# Patient Record
Sex: Female | Born: 1935
Health system: Southern US, Community
[De-identification: ages and names within clinical notes are randomized; demographics above are authoritative.]

## PROBLEM LIST (undated history)

## (undated) DIAGNOSIS — F0390 Unspecified dementia without behavioral disturbance: Secondary | ICD-10-CM

## (undated) DIAGNOSIS — J45909 Unspecified asthma, uncomplicated: Secondary | ICD-10-CM

---

## 2011-03-04 DIAGNOSIS — H93299 Other abnormal auditory perceptions, unspecified ear: Secondary | ICD-10-CM | POA: Diagnosis not present

## 2011-03-04 DIAGNOSIS — H905 Unspecified sensorineural hearing loss: Secondary | ICD-10-CM | POA: Diagnosis not present

## 2011-03-05 DIAGNOSIS — H905 Unspecified sensorineural hearing loss: Secondary | ICD-10-CM | POA: Diagnosis not present

## 2011-03-05 DIAGNOSIS — H93299 Other abnormal auditory perceptions, unspecified ear: Secondary | ICD-10-CM | POA: Diagnosis not present

## 2011-03-06 DIAGNOSIS — H93299 Other abnormal auditory perceptions, unspecified ear: Secondary | ICD-10-CM | POA: Diagnosis not present

## 2011-03-06 DIAGNOSIS — H905 Unspecified sensorineural hearing loss: Secondary | ICD-10-CM | POA: Diagnosis not present

## 2011-03-10 DIAGNOSIS — H905 Unspecified sensorineural hearing loss: Secondary | ICD-10-CM | POA: Diagnosis not present

## 2011-03-10 DIAGNOSIS — H93299 Other abnormal auditory perceptions, unspecified ear: Secondary | ICD-10-CM | POA: Diagnosis not present

## 2011-03-11 DIAGNOSIS — H93299 Other abnormal auditory perceptions, unspecified ear: Secondary | ICD-10-CM | POA: Diagnosis not present

## 2011-03-11 DIAGNOSIS — H903 Sensorineural hearing loss, bilateral: Secondary | ICD-10-CM | POA: Diagnosis not present

## 2011-03-11 DIAGNOSIS — H905 Unspecified sensorineural hearing loss: Secondary | ICD-10-CM | POA: Diagnosis not present

## 2011-03-12 DIAGNOSIS — H93299 Other abnormal auditory perceptions, unspecified ear: Secondary | ICD-10-CM | POA: Diagnosis not present

## 2011-03-12 DIAGNOSIS — H905 Unspecified sensorineural hearing loss: Secondary | ICD-10-CM | POA: Diagnosis not present

## 2011-03-16 DIAGNOSIS — E7800 Pure hypercholesterolemia, unspecified: Secondary | ICD-10-CM | POA: Diagnosis not present

## 2011-03-16 DIAGNOSIS — I10 Essential (primary) hypertension: Secondary | ICD-10-CM | POA: Diagnosis not present

## 2011-03-16 DIAGNOSIS — M810 Age-related osteoporosis without current pathological fracture: Secondary | ICD-10-CM | POA: Diagnosis not present

## 2011-03-16 DIAGNOSIS — M109 Gout, unspecified: Secondary | ICD-10-CM | POA: Diagnosis not present

## 2011-03-16 DIAGNOSIS — N183 Chronic kidney disease, stage 3 unspecified: Secondary | ICD-10-CM | POA: Diagnosis not present

## 2011-03-16 DIAGNOSIS — Z23 Encounter for immunization: Secondary | ICD-10-CM | POA: Diagnosis not present

## 2011-03-16 DIAGNOSIS — E785 Hyperlipidemia, unspecified: Secondary | ICD-10-CM | POA: Diagnosis not present

## 2011-03-16 DIAGNOSIS — Z0000 Encounter for general adult medical examination without abnormal findings: Secondary | ICD-10-CM | POA: Diagnosis not present

## 2011-03-18 DIAGNOSIS — H905 Unspecified sensorineural hearing loss: Secondary | ICD-10-CM | POA: Diagnosis not present

## 2011-03-18 DIAGNOSIS — H93299 Other abnormal auditory perceptions, unspecified ear: Secondary | ICD-10-CM | POA: Diagnosis not present

## 2011-03-19 DIAGNOSIS — H905 Unspecified sensorineural hearing loss: Secondary | ICD-10-CM | POA: Diagnosis not present

## 2011-03-19 DIAGNOSIS — H93299 Other abnormal auditory perceptions, unspecified ear: Secondary | ICD-10-CM | POA: Diagnosis not present

## 2011-03-20 DIAGNOSIS — H93299 Other abnormal auditory perceptions, unspecified ear: Secondary | ICD-10-CM | POA: Diagnosis not present

## 2011-03-20 DIAGNOSIS — H905 Unspecified sensorineural hearing loss: Secondary | ICD-10-CM | POA: Diagnosis not present

## 2011-03-23 DIAGNOSIS — H93299 Other abnormal auditory perceptions, unspecified ear: Secondary | ICD-10-CM | POA: Diagnosis not present

## 2011-03-23 DIAGNOSIS — H905 Unspecified sensorineural hearing loss: Secondary | ICD-10-CM | POA: Diagnosis not present

## 2011-03-25 DIAGNOSIS — J45909 Unspecified asthma, uncomplicated: Secondary | ICD-10-CM | POA: Diagnosis not present

## 2011-03-25 DIAGNOSIS — J3089 Other allergic rhinitis: Secondary | ICD-10-CM | POA: Diagnosis not present

## 2011-03-25 DIAGNOSIS — J301 Allergic rhinitis due to pollen: Secondary | ICD-10-CM | POA: Diagnosis not present

## 2011-03-26 DIAGNOSIS — H905 Unspecified sensorineural hearing loss: Secondary | ICD-10-CM | POA: Diagnosis not present

## 2011-03-26 DIAGNOSIS — H93299 Other abnormal auditory perceptions, unspecified ear: Secondary | ICD-10-CM | POA: Diagnosis not present

## 2011-03-27 DIAGNOSIS — H905 Unspecified sensorineural hearing loss: Secondary | ICD-10-CM | POA: Diagnosis not present

## 2011-03-27 DIAGNOSIS — H93299 Other abnormal auditory perceptions, unspecified ear: Secondary | ICD-10-CM | POA: Diagnosis not present

## 2011-03-30 DIAGNOSIS — H905 Unspecified sensorineural hearing loss: Secondary | ICD-10-CM | POA: Diagnosis not present

## 2011-03-30 DIAGNOSIS — H93299 Other abnormal auditory perceptions, unspecified ear: Secondary | ICD-10-CM | POA: Diagnosis not present

## 2011-03-30 DIAGNOSIS — M810 Age-related osteoporosis without current pathological fracture: Secondary | ICD-10-CM | POA: Diagnosis not present

## 2011-03-31 DIAGNOSIS — H905 Unspecified sensorineural hearing loss: Secondary | ICD-10-CM | POA: Diagnosis not present

## 2011-03-31 DIAGNOSIS — H93299 Other abnormal auditory perceptions, unspecified ear: Secondary | ICD-10-CM | POA: Diagnosis not present

## 2011-04-01 DIAGNOSIS — H905 Unspecified sensorineural hearing loss: Secondary | ICD-10-CM | POA: Diagnosis not present

## 2011-04-01 DIAGNOSIS — H93299 Other abnormal auditory perceptions, unspecified ear: Secondary | ICD-10-CM | POA: Diagnosis not present

## 2011-04-06 DIAGNOSIS — H93299 Other abnormal auditory perceptions, unspecified ear: Secondary | ICD-10-CM | POA: Diagnosis not present

## 2011-04-06 DIAGNOSIS — R42 Dizziness and giddiness: Secondary | ICD-10-CM | POA: Diagnosis not present

## 2011-04-06 DIAGNOSIS — H905 Unspecified sensorineural hearing loss: Secondary | ICD-10-CM | POA: Diagnosis not present

## 2011-04-06 DIAGNOSIS — R269 Unspecified abnormalities of gait and mobility: Secondary | ICD-10-CM | POA: Diagnosis not present

## 2011-04-07 DIAGNOSIS — H93299 Other abnormal auditory perceptions, unspecified ear: Secondary | ICD-10-CM | POA: Diagnosis not present

## 2011-04-07 DIAGNOSIS — H905 Unspecified sensorineural hearing loss: Secondary | ICD-10-CM | POA: Diagnosis not present

## 2011-04-07 DIAGNOSIS — R269 Unspecified abnormalities of gait and mobility: Secondary | ICD-10-CM | POA: Diagnosis not present

## 2011-04-07 DIAGNOSIS — R42 Dizziness and giddiness: Secondary | ICD-10-CM | POA: Diagnosis not present

## 2011-04-08 DIAGNOSIS — H905 Unspecified sensorineural hearing loss: Secondary | ICD-10-CM | POA: Diagnosis not present

## 2011-04-08 DIAGNOSIS — R42 Dizziness and giddiness: Secondary | ICD-10-CM | POA: Diagnosis not present

## 2011-04-08 DIAGNOSIS — H93299 Other abnormal auditory perceptions, unspecified ear: Secondary | ICD-10-CM | POA: Diagnosis not present

## 2011-04-08 DIAGNOSIS — R269 Unspecified abnormalities of gait and mobility: Secondary | ICD-10-CM | POA: Diagnosis not present

## 2011-04-09 DIAGNOSIS — H93299 Other abnormal auditory perceptions, unspecified ear: Secondary | ICD-10-CM | POA: Diagnosis not present

## 2011-04-09 DIAGNOSIS — R42 Dizziness and giddiness: Secondary | ICD-10-CM | POA: Diagnosis not present

## 2011-04-09 DIAGNOSIS — R269 Unspecified abnormalities of gait and mobility: Secondary | ICD-10-CM | POA: Diagnosis not present

## 2011-04-09 DIAGNOSIS — H905 Unspecified sensorineural hearing loss: Secondary | ICD-10-CM | POA: Diagnosis not present

## 2011-04-13 DIAGNOSIS — Z0000 Encounter for general adult medical examination without abnormal findings: Secondary | ICD-10-CM | POA: Diagnosis not present

## 2011-04-13 DIAGNOSIS — Z9889 Other specified postprocedural states: Secondary | ICD-10-CM | POA: Diagnosis not present

## 2011-04-13 DIAGNOSIS — I10 Essential (primary) hypertension: Secondary | ICD-10-CM | POA: Diagnosis not present

## 2011-04-13 DIAGNOSIS — Z23 Encounter for immunization: Secondary | ICD-10-CM | POA: Diagnosis not present

## 2011-04-13 DIAGNOSIS — N183 Chronic kidney disease, stage 3 unspecified: Secondary | ICD-10-CM | POA: Diagnosis not present

## 2011-04-14 DIAGNOSIS — R42 Dizziness and giddiness: Secondary | ICD-10-CM | POA: Diagnosis not present

## 2011-04-14 DIAGNOSIS — H93299 Other abnormal auditory perceptions, unspecified ear: Secondary | ICD-10-CM | POA: Diagnosis not present

## 2011-04-14 DIAGNOSIS — R269 Unspecified abnormalities of gait and mobility: Secondary | ICD-10-CM | POA: Diagnosis not present

## 2011-04-14 DIAGNOSIS — H905 Unspecified sensorineural hearing loss: Secondary | ICD-10-CM | POA: Diagnosis not present

## 2011-04-15 DIAGNOSIS — R42 Dizziness and giddiness: Secondary | ICD-10-CM | POA: Diagnosis not present

## 2011-04-15 DIAGNOSIS — R269 Unspecified abnormalities of gait and mobility: Secondary | ICD-10-CM | POA: Diagnosis not present

## 2011-04-15 DIAGNOSIS — H93299 Other abnormal auditory perceptions, unspecified ear: Secondary | ICD-10-CM | POA: Diagnosis not present

## 2011-04-15 DIAGNOSIS — H905 Unspecified sensorineural hearing loss: Secondary | ICD-10-CM | POA: Diagnosis not present

## 2011-04-16 DIAGNOSIS — R42 Dizziness and giddiness: Secondary | ICD-10-CM | POA: Diagnosis not present

## 2011-04-16 DIAGNOSIS — H905 Unspecified sensorineural hearing loss: Secondary | ICD-10-CM | POA: Diagnosis not present

## 2011-04-16 DIAGNOSIS — R269 Unspecified abnormalities of gait and mobility: Secondary | ICD-10-CM | POA: Diagnosis not present

## 2011-04-16 DIAGNOSIS — H93299 Other abnormal auditory perceptions, unspecified ear: Secondary | ICD-10-CM | POA: Diagnosis not present

## 2011-04-17 DIAGNOSIS — R42 Dizziness and giddiness: Secondary | ICD-10-CM | POA: Diagnosis not present

## 2011-04-17 DIAGNOSIS — H93299 Other abnormal auditory perceptions, unspecified ear: Secondary | ICD-10-CM | POA: Diagnosis not present

## 2011-04-17 DIAGNOSIS — R269 Unspecified abnormalities of gait and mobility: Secondary | ICD-10-CM | POA: Diagnosis not present

## 2011-04-17 DIAGNOSIS — H905 Unspecified sensorineural hearing loss: Secondary | ICD-10-CM | POA: Diagnosis not present

## 2011-04-20 DIAGNOSIS — R269 Unspecified abnormalities of gait and mobility: Secondary | ICD-10-CM | POA: Diagnosis not present

## 2011-04-20 DIAGNOSIS — H93299 Other abnormal auditory perceptions, unspecified ear: Secondary | ICD-10-CM | POA: Diagnosis not present

## 2011-04-20 DIAGNOSIS — R42 Dizziness and giddiness: Secondary | ICD-10-CM | POA: Diagnosis not present

## 2011-04-20 DIAGNOSIS — H905 Unspecified sensorineural hearing loss: Secondary | ICD-10-CM | POA: Diagnosis not present

## 2011-04-21 DIAGNOSIS — H905 Unspecified sensorineural hearing loss: Secondary | ICD-10-CM | POA: Diagnosis not present

## 2011-04-21 DIAGNOSIS — H93299 Other abnormal auditory perceptions, unspecified ear: Secondary | ICD-10-CM | POA: Diagnosis not present

## 2011-04-21 DIAGNOSIS — R42 Dizziness and giddiness: Secondary | ICD-10-CM | POA: Diagnosis not present

## 2011-04-21 DIAGNOSIS — R269 Unspecified abnormalities of gait and mobility: Secondary | ICD-10-CM | POA: Diagnosis not present

## 2011-04-22 DIAGNOSIS — H905 Unspecified sensorineural hearing loss: Secondary | ICD-10-CM | POA: Diagnosis not present

## 2011-04-22 DIAGNOSIS — H93299 Other abnormal auditory perceptions, unspecified ear: Secondary | ICD-10-CM | POA: Diagnosis not present

## 2011-04-22 DIAGNOSIS — R269 Unspecified abnormalities of gait and mobility: Secondary | ICD-10-CM | POA: Diagnosis not present

## 2011-04-22 DIAGNOSIS — R42 Dizziness and giddiness: Secondary | ICD-10-CM | POA: Diagnosis not present

## 2011-04-24 DIAGNOSIS — R269 Unspecified abnormalities of gait and mobility: Secondary | ICD-10-CM | POA: Diagnosis not present

## 2011-04-24 DIAGNOSIS — H905 Unspecified sensorineural hearing loss: Secondary | ICD-10-CM | POA: Diagnosis not present

## 2011-04-24 DIAGNOSIS — R42 Dizziness and giddiness: Secondary | ICD-10-CM | POA: Diagnosis not present

## 2011-04-24 DIAGNOSIS — H93299 Other abnormal auditory perceptions, unspecified ear: Secondary | ICD-10-CM | POA: Diagnosis not present

## 2011-04-27 DIAGNOSIS — H905 Unspecified sensorineural hearing loss: Secondary | ICD-10-CM | POA: Diagnosis not present

## 2011-04-27 DIAGNOSIS — R42 Dizziness and giddiness: Secondary | ICD-10-CM | POA: Diagnosis not present

## 2011-04-27 DIAGNOSIS — H93299 Other abnormal auditory perceptions, unspecified ear: Secondary | ICD-10-CM | POA: Diagnosis not present

## 2011-04-27 DIAGNOSIS — R269 Unspecified abnormalities of gait and mobility: Secondary | ICD-10-CM | POA: Diagnosis not present

## 2011-04-28 DIAGNOSIS — R269 Unspecified abnormalities of gait and mobility: Secondary | ICD-10-CM | POA: Diagnosis not present

## 2011-04-28 DIAGNOSIS — H905 Unspecified sensorineural hearing loss: Secondary | ICD-10-CM | POA: Diagnosis not present

## 2011-04-28 DIAGNOSIS — H93299 Other abnormal auditory perceptions, unspecified ear: Secondary | ICD-10-CM | POA: Diagnosis not present

## 2011-04-28 DIAGNOSIS — R42 Dizziness and giddiness: Secondary | ICD-10-CM | POA: Diagnosis not present

## 2011-04-29 DIAGNOSIS — H905 Unspecified sensorineural hearing loss: Secondary | ICD-10-CM | POA: Diagnosis not present

## 2011-04-29 DIAGNOSIS — R269 Unspecified abnormalities of gait and mobility: Secondary | ICD-10-CM | POA: Diagnosis not present

## 2011-04-29 DIAGNOSIS — R42 Dizziness and giddiness: Secondary | ICD-10-CM | POA: Diagnosis not present

## 2011-04-29 DIAGNOSIS — H93299 Other abnormal auditory perceptions, unspecified ear: Secondary | ICD-10-CM | POA: Diagnosis not present

## 2011-04-30 DIAGNOSIS — H93299 Other abnormal auditory perceptions, unspecified ear: Secondary | ICD-10-CM | POA: Diagnosis not present

## 2011-04-30 DIAGNOSIS — R269 Unspecified abnormalities of gait and mobility: Secondary | ICD-10-CM | POA: Diagnosis not present

## 2011-04-30 DIAGNOSIS — H905 Unspecified sensorineural hearing loss: Secondary | ICD-10-CM | POA: Diagnosis not present

## 2011-04-30 DIAGNOSIS — R42 Dizziness and giddiness: Secondary | ICD-10-CM | POA: Diagnosis not present

## 2011-05-01 DIAGNOSIS — R269 Unspecified abnormalities of gait and mobility: Secondary | ICD-10-CM | POA: Diagnosis not present

## 2011-05-01 DIAGNOSIS — H93299 Other abnormal auditory perceptions, unspecified ear: Secondary | ICD-10-CM | POA: Diagnosis not present

## 2011-05-01 DIAGNOSIS — R42 Dizziness and giddiness: Secondary | ICD-10-CM | POA: Diagnosis not present

## 2011-05-01 DIAGNOSIS — H905 Unspecified sensorineural hearing loss: Secondary | ICD-10-CM | POA: Diagnosis not present

## 2011-05-04 DIAGNOSIS — R42 Dizziness and giddiness: Secondary | ICD-10-CM | POA: Diagnosis not present

## 2011-05-04 DIAGNOSIS — H905 Unspecified sensorineural hearing loss: Secondary | ICD-10-CM | POA: Diagnosis not present

## 2011-05-04 DIAGNOSIS — H93299 Other abnormal auditory perceptions, unspecified ear: Secondary | ICD-10-CM | POA: Diagnosis not present

## 2011-05-04 DIAGNOSIS — R269 Unspecified abnormalities of gait and mobility: Secondary | ICD-10-CM | POA: Diagnosis not present

## 2011-05-05 DIAGNOSIS — R42 Dizziness and giddiness: Secondary | ICD-10-CM | POA: Diagnosis not present

## 2011-05-05 DIAGNOSIS — H905 Unspecified sensorineural hearing loss: Secondary | ICD-10-CM | POA: Diagnosis not present

## 2011-05-05 DIAGNOSIS — R269 Unspecified abnormalities of gait and mobility: Secondary | ICD-10-CM | POA: Diagnosis not present

## 2011-05-05 DIAGNOSIS — H93299 Other abnormal auditory perceptions, unspecified ear: Secondary | ICD-10-CM | POA: Diagnosis not present

## 2011-05-06 DIAGNOSIS — H93299 Other abnormal auditory perceptions, unspecified ear: Secondary | ICD-10-CM | POA: Diagnosis not present

## 2011-05-06 DIAGNOSIS — H905 Unspecified sensorineural hearing loss: Secondary | ICD-10-CM | POA: Diagnosis not present

## 2011-05-06 DIAGNOSIS — R269 Unspecified abnormalities of gait and mobility: Secondary | ICD-10-CM | POA: Diagnosis not present

## 2011-05-06 DIAGNOSIS — R42 Dizziness and giddiness: Secondary | ICD-10-CM | POA: Diagnosis not present

## 2011-05-08 DIAGNOSIS — R092 Respiratory arrest: Secondary | ICD-10-CM | POA: Diagnosis not present

## 2011-05-08 DIAGNOSIS — H905 Unspecified sensorineural hearing loss: Secondary | ICD-10-CM | POA: Diagnosis not present

## 2011-05-08 DIAGNOSIS — J440 Chronic obstructive pulmonary disease with acute lower respiratory infection: Secondary | ICD-10-CM | POA: Diagnosis not present

## 2011-05-08 DIAGNOSIS — A499 Bacterial infection, unspecified: Secondary | ICD-10-CM | POA: Diagnosis not present

## 2011-05-08 DIAGNOSIS — R269 Unspecified abnormalities of gait and mobility: Secondary | ICD-10-CM | POA: Diagnosis not present

## 2011-05-08 DIAGNOSIS — R509 Fever, unspecified: Secondary | ICD-10-CM | POA: Diagnosis not present

## 2011-05-08 DIAGNOSIS — K219 Gastro-esophageal reflux disease without esophagitis: Secondary | ICD-10-CM | POA: Diagnosis not present

## 2011-05-08 DIAGNOSIS — B9689 Other specified bacterial agents as the cause of diseases classified elsewhere: Secondary | ICD-10-CM | POA: Diagnosis not present

## 2011-05-08 DIAGNOSIS — R42 Dizziness and giddiness: Secondary | ICD-10-CM | POA: Diagnosis not present

## 2011-05-08 DIAGNOSIS — N390 Urinary tract infection, site not specified: Secondary | ICD-10-CM | POA: Diagnosis not present

## 2011-05-08 DIAGNOSIS — A399 Meningococcal infection, unspecified: Secondary | ICD-10-CM | POA: Diagnosis not present

## 2011-05-08 DIAGNOSIS — R059 Cough, unspecified: Secondary | ICD-10-CM | POA: Diagnosis not present

## 2011-05-08 DIAGNOSIS — J209 Acute bronchitis, unspecified: Secondary | ICD-10-CM | POA: Diagnosis not present

## 2011-05-08 DIAGNOSIS — H93299 Other abnormal auditory perceptions, unspecified ear: Secondary | ICD-10-CM | POA: Diagnosis not present

## 2011-05-08 DIAGNOSIS — R05 Cough: Secondary | ICD-10-CM | POA: Diagnosis not present

## 2011-05-13 DIAGNOSIS — R269 Unspecified abnormalities of gait and mobility: Secondary | ICD-10-CM | POA: Diagnosis not present

## 2011-05-13 DIAGNOSIS — H905 Unspecified sensorineural hearing loss: Secondary | ICD-10-CM | POA: Diagnosis not present

## 2011-05-13 DIAGNOSIS — H93299 Other abnormal auditory perceptions, unspecified ear: Secondary | ICD-10-CM | POA: Diagnosis not present

## 2011-05-13 DIAGNOSIS — R42 Dizziness and giddiness: Secondary | ICD-10-CM | POA: Diagnosis not present

## 2011-05-19 DIAGNOSIS — N183 Chronic kidney disease, stage 3 unspecified: Secondary | ICD-10-CM | POA: Diagnosis not present

## 2011-05-19 DIAGNOSIS — J449 Chronic obstructive pulmonary disease, unspecified: Secondary | ICD-10-CM | POA: Diagnosis not present

## 2011-05-19 DIAGNOSIS — N390 Urinary tract infection, site not specified: Secondary | ICD-10-CM | POA: Diagnosis not present

## 2011-05-20 DIAGNOSIS — R269 Unspecified abnormalities of gait and mobility: Secondary | ICD-10-CM | POA: Diagnosis not present

## 2011-05-20 DIAGNOSIS — H905 Unspecified sensorineural hearing loss: Secondary | ICD-10-CM | POA: Diagnosis not present

## 2011-05-20 DIAGNOSIS — H93299 Other abnormal auditory perceptions, unspecified ear: Secondary | ICD-10-CM | POA: Diagnosis not present

## 2011-05-20 DIAGNOSIS — R42 Dizziness and giddiness: Secondary | ICD-10-CM | POA: Diagnosis not present

## 2011-06-02 DIAGNOSIS — M810 Age-related osteoporosis without current pathological fracture: Secondary | ICD-10-CM | POA: Diagnosis not present

## 2011-06-02 DIAGNOSIS — M25559 Pain in unspecified hip: Secondary | ICD-10-CM | POA: Diagnosis not present

## 2011-06-08 DIAGNOSIS — H903 Sensorineural hearing loss, bilateral: Secondary | ICD-10-CM | POA: Diagnosis not present

## 2011-06-26 DIAGNOSIS — J45909 Unspecified asthma, uncomplicated: Secondary | ICD-10-CM | POA: Diagnosis not present

## 2011-06-26 DIAGNOSIS — J479 Bronchiectasis, uncomplicated: Secondary | ICD-10-CM | POA: Diagnosis not present

## 2011-07-17 DIAGNOSIS — H903 Sensorineural hearing loss, bilateral: Secondary | ICD-10-CM | POA: Diagnosis not present

## 2011-09-01 DIAGNOSIS — J449 Chronic obstructive pulmonary disease, unspecified: Secondary | ICD-10-CM | POA: Diagnosis not present

## 2011-09-04 DIAGNOSIS — J471 Bronchiectasis with (acute) exacerbation: Secondary | ICD-10-CM | POA: Diagnosis not present

## 2011-09-04 DIAGNOSIS — J45909 Unspecified asthma, uncomplicated: Secondary | ICD-10-CM | POA: Diagnosis not present

## 2011-09-04 DIAGNOSIS — J310 Chronic rhinitis: Secondary | ICD-10-CM | POA: Diagnosis not present

## 2011-09-16 DIAGNOSIS — J479 Bronchiectasis, uncomplicated: Secondary | ICD-10-CM | POA: Diagnosis not present

## 2011-10-12 DIAGNOSIS — H9190 Unspecified hearing loss, unspecified ear: Secondary | ICD-10-CM | POA: Diagnosis not present

## 2011-10-12 DIAGNOSIS — R413 Other amnesia: Secondary | ICD-10-CM | POA: Diagnosis not present

## 2011-10-12 DIAGNOSIS — M47817 Spondylosis without myelopathy or radiculopathy, lumbosacral region: Secondary | ICD-10-CM | POA: Diagnosis not present

## 2011-10-12 DIAGNOSIS — M1990 Unspecified osteoarthritis, unspecified site: Secondary | ICD-10-CM | POA: Diagnosis not present

## 2011-10-26 DIAGNOSIS — K649 Unspecified hemorrhoids: Secondary | ICD-10-CM | POA: Diagnosis not present

## 2011-10-26 DIAGNOSIS — K625 Hemorrhage of anus and rectum: Secondary | ICD-10-CM | POA: Diagnosis not present

## 2011-10-26 DIAGNOSIS — K5900 Constipation, unspecified: Secondary | ICD-10-CM | POA: Diagnosis not present

## 2011-10-28 DIAGNOSIS — N318 Other neuromuscular dysfunction of bladder: Secondary | ICD-10-CM | POA: Diagnosis not present

## 2011-11-05 DIAGNOSIS — J479 Bronchiectasis, uncomplicated: Secondary | ICD-10-CM | POA: Diagnosis not present

## 2011-11-11 DIAGNOSIS — Z1231 Encounter for screening mammogram for malignant neoplasm of breast: Secondary | ICD-10-CM | POA: Diagnosis not present

## 2011-11-12 DIAGNOSIS — M19079 Primary osteoarthritis, unspecified ankle and foot: Secondary | ICD-10-CM | POA: Diagnosis not present

## 2011-11-12 DIAGNOSIS — M7750 Other enthesopathy of unspecified foot: Secondary | ICD-10-CM | POA: Diagnosis not present

## 2011-11-12 DIAGNOSIS — M79609 Pain in unspecified limb: Secondary | ICD-10-CM | POA: Diagnosis not present

## 2011-12-02 DIAGNOSIS — F39 Unspecified mood [affective] disorder: Secondary | ICD-10-CM | POA: Diagnosis not present

## 2011-12-02 DIAGNOSIS — Z23 Encounter for immunization: Secondary | ICD-10-CM | POA: Diagnosis not present

## 2011-12-02 DIAGNOSIS — F329 Major depressive disorder, single episode, unspecified: Secondary | ICD-10-CM | POA: Diagnosis not present

## 2011-12-02 DIAGNOSIS — F3289 Other specified depressive episodes: Secondary | ICD-10-CM | POA: Diagnosis not present

## 2011-12-21 DIAGNOSIS — H903 Sensorineural hearing loss, bilateral: Secondary | ICD-10-CM | POA: Diagnosis not present

## 2011-12-21 DIAGNOSIS — H905 Unspecified sensorineural hearing loss: Secondary | ICD-10-CM | POA: Diagnosis not present

## 2012-01-12 DIAGNOSIS — J471 Bronchiectasis with (acute) exacerbation: Secondary | ICD-10-CM | POA: Diagnosis not present

## 2012-02-03 DIAGNOSIS — H26499 Other secondary cataract, unspecified eye: Secondary | ICD-10-CM | POA: Diagnosis not present

## 2012-02-18 DIAGNOSIS — J479 Bronchiectasis, uncomplicated: Secondary | ICD-10-CM | POA: Diagnosis not present

## 2012-02-18 DIAGNOSIS — J45909 Unspecified asthma, uncomplicated: Secondary | ICD-10-CM | POA: Diagnosis not present

## 2012-02-25 DIAGNOSIS — J019 Acute sinusitis, unspecified: Secondary | ICD-10-CM | POA: Diagnosis not present

## 2012-02-25 DIAGNOSIS — R059 Cough, unspecified: Secondary | ICD-10-CM | POA: Diagnosis not present

## 2012-02-25 DIAGNOSIS — R05 Cough: Secondary | ICD-10-CM | POA: Diagnosis not present

## 2012-02-25 DIAGNOSIS — J310 Chronic rhinitis: Secondary | ICD-10-CM | POA: Diagnosis not present

## 2012-02-25 DIAGNOSIS — R918 Other nonspecific abnormal finding of lung field: Secondary | ICD-10-CM | POA: Diagnosis not present

## 2012-02-25 DIAGNOSIS — J479 Bronchiectasis, uncomplicated: Secondary | ICD-10-CM | POA: Diagnosis not present

## 2012-03-21 DIAGNOSIS — J479 Bronchiectasis, uncomplicated: Secondary | ICD-10-CM | POA: Diagnosis not present

## 2012-03-31 DIAGNOSIS — H905 Unspecified sensorineural hearing loss: Secondary | ICD-10-CM | POA: Diagnosis not present

## 2012-03-31 DIAGNOSIS — H903 Sensorineural hearing loss, bilateral: Secondary | ICD-10-CM | POA: Diagnosis not present

## 2012-03-31 DIAGNOSIS — M1710 Unilateral primary osteoarthritis, unspecified knee: Secondary | ICD-10-CM | POA: Diagnosis not present

## 2012-04-05 DIAGNOSIS — E785 Hyperlipidemia, unspecified: Secondary | ICD-10-CM | POA: Diagnosis not present

## 2012-04-05 DIAGNOSIS — Z0000 Encounter for general adult medical examination without abnormal findings: Secondary | ICD-10-CM | POA: Diagnosis not present

## 2012-04-05 DIAGNOSIS — I10 Essential (primary) hypertension: Secondary | ICD-10-CM | POA: Diagnosis not present

## 2012-04-05 DIAGNOSIS — N183 Chronic kidney disease, stage 3 unspecified: Secondary | ICD-10-CM | POA: Diagnosis not present

## 2012-04-05 DIAGNOSIS — M109 Gout, unspecified: Secondary | ICD-10-CM | POA: Diagnosis not present

## 2012-04-05 DIAGNOSIS — M810 Age-related osteoporosis without current pathological fracture: Secondary | ICD-10-CM | POA: Diagnosis not present

## 2012-04-11 DIAGNOSIS — D510 Vitamin B12 deficiency anemia due to intrinsic factor deficiency: Secondary | ICD-10-CM | POA: Diagnosis not present

## 2012-04-11 DIAGNOSIS — D649 Anemia, unspecified: Secondary | ICD-10-CM | POA: Diagnosis not present

## 2012-04-20 DIAGNOSIS — D649 Anemia, unspecified: Secondary | ICD-10-CM | POA: Diagnosis not present

## 2012-04-26 DIAGNOSIS — H9190 Unspecified hearing loss, unspecified ear: Secondary | ICD-10-CM | POA: Diagnosis not present

## 2012-04-26 DIAGNOSIS — H93299 Other abnormal auditory perceptions, unspecified ear: Secondary | ICD-10-CM | POA: Diagnosis not present

## 2012-04-28 DIAGNOSIS — H93299 Other abnormal auditory perceptions, unspecified ear: Secondary | ICD-10-CM | POA: Diagnosis not present

## 2012-04-28 DIAGNOSIS — H9190 Unspecified hearing loss, unspecified ear: Secondary | ICD-10-CM | POA: Diagnosis not present

## 2012-05-03 DIAGNOSIS — D649 Anemia, unspecified: Secondary | ICD-10-CM | POA: Diagnosis not present

## 2012-05-03 DIAGNOSIS — H9190 Unspecified hearing loss, unspecified ear: Secondary | ICD-10-CM | POA: Diagnosis not present

## 2012-05-03 DIAGNOSIS — H93299 Other abnormal auditory perceptions, unspecified ear: Secondary | ICD-10-CM | POA: Diagnosis not present

## 2012-05-06 DIAGNOSIS — H93299 Other abnormal auditory perceptions, unspecified ear: Secondary | ICD-10-CM | POA: Diagnosis not present

## 2012-05-06 DIAGNOSIS — H9190 Unspecified hearing loss, unspecified ear: Secondary | ICD-10-CM | POA: Diagnosis not present

## 2012-05-08 DIAGNOSIS — H93299 Other abnormal auditory perceptions, unspecified ear: Secondary | ICD-10-CM | POA: Diagnosis not present

## 2012-05-08 DIAGNOSIS — H9190 Unspecified hearing loss, unspecified ear: Secondary | ICD-10-CM | POA: Diagnosis not present

## 2012-05-09 DIAGNOSIS — H9190 Unspecified hearing loss, unspecified ear: Secondary | ICD-10-CM | POA: Diagnosis not present

## 2012-05-09 DIAGNOSIS — H93299 Other abnormal auditory perceptions, unspecified ear: Secondary | ICD-10-CM | POA: Diagnosis not present

## 2012-05-16 DIAGNOSIS — H9190 Unspecified hearing loss, unspecified ear: Secondary | ICD-10-CM | POA: Diagnosis not present

## 2012-05-16 DIAGNOSIS — H93299 Other abnormal auditory perceptions, unspecified ear: Secondary | ICD-10-CM | POA: Diagnosis not present

## 2012-05-20 DIAGNOSIS — H93299 Other abnormal auditory perceptions, unspecified ear: Secondary | ICD-10-CM | POA: Diagnosis not present

## 2012-05-20 DIAGNOSIS — H9190 Unspecified hearing loss, unspecified ear: Secondary | ICD-10-CM | POA: Diagnosis not present

## 2012-05-23 DIAGNOSIS — H9190 Unspecified hearing loss, unspecified ear: Secondary | ICD-10-CM | POA: Diagnosis not present

## 2012-05-23 DIAGNOSIS — H93299 Other abnormal auditory perceptions, unspecified ear: Secondary | ICD-10-CM | POA: Diagnosis not present

## 2012-05-25 DIAGNOSIS — H9190 Unspecified hearing loss, unspecified ear: Secondary | ICD-10-CM | POA: Diagnosis not present

## 2012-05-25 DIAGNOSIS — H93299 Other abnormal auditory perceptions, unspecified ear: Secondary | ICD-10-CM | POA: Diagnosis not present

## 2012-05-26 DIAGNOSIS — J471 Bronchiectasis with (acute) exacerbation: Secondary | ICD-10-CM | POA: Diagnosis not present

## 2012-05-26 DIAGNOSIS — R059 Cough, unspecified: Secondary | ICD-10-CM | POA: Diagnosis not present

## 2012-05-26 DIAGNOSIS — R05 Cough: Secondary | ICD-10-CM | POA: Diagnosis not present

## 2012-05-28 DIAGNOSIS — I129 Hypertensive chronic kidney disease with stage 1 through stage 4 chronic kidney disease, or unspecified chronic kidney disease: Secondary | ICD-10-CM | POA: Diagnosis present

## 2012-05-28 DIAGNOSIS — M810 Age-related osteoporosis without current pathological fracture: Secondary | ICD-10-CM | POA: Diagnosis present

## 2012-05-28 DIAGNOSIS — K219 Gastro-esophageal reflux disease without esophagitis: Secondary | ICD-10-CM | POA: Diagnosis present

## 2012-05-28 DIAGNOSIS — J471 Bronchiectasis with (acute) exacerbation: Secondary | ICD-10-CM | POA: Diagnosis not present

## 2012-05-28 DIAGNOSIS — M129 Arthropathy, unspecified: Secondary | ICD-10-CM | POA: Diagnosis present

## 2012-05-28 DIAGNOSIS — R0609 Other forms of dyspnea: Secondary | ICD-10-CM | POA: Diagnosis not present

## 2012-05-28 DIAGNOSIS — M47817 Spondylosis without myelopathy or radiculopathy, lumbosacral region: Secondary | ICD-10-CM | POA: Diagnosis present

## 2012-05-28 DIAGNOSIS — H9190 Unspecified hearing loss, unspecified ear: Secondary | ICD-10-CM | POA: Diagnosis present

## 2012-05-28 DIAGNOSIS — I447 Left bundle-branch block, unspecified: Secondary | ICD-10-CM | POA: Diagnosis not present

## 2012-05-28 DIAGNOSIS — N179 Acute kidney failure, unspecified: Secondary | ICD-10-CM | POA: Diagnosis not present

## 2012-05-28 DIAGNOSIS — M47812 Spondylosis without myelopathy or radiculopathy, cervical region: Secondary | ICD-10-CM | POA: Diagnosis present

## 2012-05-28 DIAGNOSIS — J45909 Unspecified asthma, uncomplicated: Secondary | ICD-10-CM | POA: Diagnosis present

## 2012-05-28 DIAGNOSIS — I10 Essential (primary) hypertension: Secondary | ICD-10-CM | POA: Diagnosis not present

## 2012-05-28 DIAGNOSIS — M6281 Muscle weakness (generalized): Secondary | ICD-10-CM | POA: Diagnosis not present

## 2012-05-28 DIAGNOSIS — E785 Hyperlipidemia, unspecified: Secondary | ICD-10-CM | POA: Diagnosis not present

## 2012-05-28 DIAGNOSIS — G4700 Insomnia, unspecified: Secondary | ICD-10-CM | POA: Diagnosis present

## 2012-05-28 DIAGNOSIS — D509 Iron deficiency anemia, unspecified: Secondary | ICD-10-CM | POA: Diagnosis not present

## 2012-05-28 DIAGNOSIS — K449 Diaphragmatic hernia without obstruction or gangrene: Secondary | ICD-10-CM | POA: Diagnosis present

## 2012-05-28 DIAGNOSIS — I959 Hypotension, unspecified: Secondary | ICD-10-CM | POA: Diagnosis not present

## 2012-05-28 DIAGNOSIS — J45901 Unspecified asthma with (acute) exacerbation: Secondary | ICD-10-CM | POA: Diagnosis not present

## 2012-05-28 DIAGNOSIS — J479 Bronchiectasis, uncomplicated: Secondary | ICD-10-CM | POA: Diagnosis not present

## 2012-05-28 DIAGNOSIS — F329 Major depressive disorder, single episode, unspecified: Secondary | ICD-10-CM | POA: Diagnosis present

## 2012-05-28 DIAGNOSIS — E7800 Pure hypercholesterolemia, unspecified: Secondary | ICD-10-CM | POA: Diagnosis not present

## 2012-05-28 DIAGNOSIS — J189 Pneumonia, unspecified organism: Secondary | ICD-10-CM | POA: Diagnosis not present

## 2012-05-28 DIAGNOSIS — N289 Disorder of kidney and ureter, unspecified: Secondary | ICD-10-CM | POA: Diagnosis not present

## 2012-05-28 DIAGNOSIS — R0602 Shortness of breath: Secondary | ICD-10-CM | POA: Diagnosis not present

## 2012-05-28 DIAGNOSIS — R0989 Other specified symptoms and signs involving the circulatory and respiratory systems: Secondary | ICD-10-CM | POA: Diagnosis not present

## 2012-05-28 DIAGNOSIS — J984 Other disorders of lung: Secondary | ICD-10-CM | POA: Diagnosis not present

## 2012-05-28 DIAGNOSIS — R918 Other nonspecific abnormal finding of lung field: Secondary | ICD-10-CM | POA: Diagnosis present

## 2012-05-28 DIAGNOSIS — R32 Unspecified urinary incontinence: Secondary | ICD-10-CM | POA: Diagnosis present

## 2012-05-28 DIAGNOSIS — R05 Cough: Secondary | ICD-10-CM | POA: Diagnosis not present

## 2012-05-28 DIAGNOSIS — F3289 Other specified depressive episodes: Secondary | ICD-10-CM | POA: Diagnosis present

## 2012-05-28 DIAGNOSIS — M109 Gout, unspecified: Secondary | ICD-10-CM | POA: Diagnosis present

## 2012-05-28 DIAGNOSIS — J441 Chronic obstructive pulmonary disease with (acute) exacerbation: Secondary | ICD-10-CM | POA: Diagnosis not present

## 2012-05-28 DIAGNOSIS — J159 Unspecified bacterial pneumonia: Secondary | ICD-10-CM | POA: Diagnosis not present

## 2012-05-28 DIAGNOSIS — D649 Anemia, unspecified: Secondary | ICD-10-CM | POA: Diagnosis not present

## 2012-05-28 DIAGNOSIS — N183 Chronic kidney disease, stage 3 unspecified: Secondary | ICD-10-CM | POA: Diagnosis not present

## 2012-05-28 DIAGNOSIS — R509 Fever, unspecified: Secondary | ICD-10-CM | POA: Diagnosis not present

## 2012-05-28 DIAGNOSIS — Z87891 Personal history of nicotine dependence: Secondary | ICD-10-CM | POA: Diagnosis not present

## 2012-05-28 DIAGNOSIS — R059 Cough, unspecified: Secondary | ICD-10-CM | POA: Diagnosis not present

## 2012-05-28 DIAGNOSIS — R9431 Abnormal electrocardiogram [ECG] [EKG]: Secondary | ICD-10-CM | POA: Diagnosis not present

## 2012-06-03 DIAGNOSIS — F329 Major depressive disorder, single episode, unspecified: Secondary | ICD-10-CM | POA: Diagnosis not present

## 2012-06-03 DIAGNOSIS — J479 Bronchiectasis, uncomplicated: Secondary | ICD-10-CM | POA: Diagnosis not present

## 2012-06-03 DIAGNOSIS — J180 Bronchopneumonia, unspecified organism: Secondary | ICD-10-CM | POA: Diagnosis not present

## 2012-06-03 DIAGNOSIS — D649 Anemia, unspecified: Secondary | ICD-10-CM | POA: Diagnosis not present

## 2012-06-03 DIAGNOSIS — J45909 Unspecified asthma, uncomplicated: Secondary | ICD-10-CM | POA: Diagnosis not present

## 2012-06-03 DIAGNOSIS — I10 Essential (primary) hypertension: Secondary | ICD-10-CM | POA: Diagnosis not present

## 2012-06-03 DIAGNOSIS — F3289 Other specified depressive episodes: Secondary | ICD-10-CM | POA: Diagnosis not present

## 2012-06-03 DIAGNOSIS — J189 Pneumonia, unspecified organism: Secondary | ICD-10-CM | POA: Diagnosis not present

## 2012-06-03 DIAGNOSIS — M6281 Muscle weakness (generalized): Secondary | ICD-10-CM | POA: Diagnosis not present

## 2012-06-03 DIAGNOSIS — D509 Iron deficiency anemia, unspecified: Secondary | ICD-10-CM | POA: Diagnosis not present

## 2012-06-03 DIAGNOSIS — N289 Disorder of kidney and ureter, unspecified: Secondary | ICD-10-CM | POA: Diagnosis not present

## 2012-06-03 DIAGNOSIS — J45901 Unspecified asthma with (acute) exacerbation: Secondary | ICD-10-CM | POA: Diagnosis not present

## 2012-06-03 DIAGNOSIS — J984 Other disorders of lung: Secondary | ICD-10-CM | POA: Diagnosis not present

## 2012-06-03 DIAGNOSIS — R059 Cough, unspecified: Secondary | ICD-10-CM | POA: Diagnosis not present

## 2012-06-03 DIAGNOSIS — J441 Chronic obstructive pulmonary disease with (acute) exacerbation: Secondary | ICD-10-CM | POA: Diagnosis not present

## 2012-06-03 DIAGNOSIS — R0602 Shortness of breath: Secondary | ICD-10-CM | POA: Diagnosis not present

## 2012-06-03 DIAGNOSIS — R05 Cough: Secondary | ICD-10-CM | POA: Diagnosis not present

## 2012-06-08 DIAGNOSIS — J180 Bronchopneumonia, unspecified organism: Secondary | ICD-10-CM | POA: Diagnosis not present

## 2012-06-14 DIAGNOSIS — J189 Pneumonia, unspecified organism: Secondary | ICD-10-CM | POA: Diagnosis not present

## 2012-06-14 DIAGNOSIS — J479 Bronchiectasis, uncomplicated: Secondary | ICD-10-CM | POA: Diagnosis not present

## 2012-06-15 DIAGNOSIS — R262 Difficulty in walking, not elsewhere classified: Secondary | ICD-10-CM | POA: Diagnosis not present

## 2012-06-15 DIAGNOSIS — J189 Pneumonia, unspecified organism: Secondary | ICD-10-CM | POA: Diagnosis not present

## 2012-06-15 DIAGNOSIS — I10 Essential (primary) hypertension: Secondary | ICD-10-CM | POA: Diagnosis not present

## 2012-06-15 DIAGNOSIS — M810 Age-related osteoporosis without current pathological fracture: Secondary | ICD-10-CM | POA: Diagnosis not present

## 2012-06-15 DIAGNOSIS — M6281 Muscle weakness (generalized): Secondary | ICD-10-CM | POA: Diagnosis not present

## 2012-06-15 DIAGNOSIS — J40 Bronchitis, not specified as acute or chronic: Secondary | ICD-10-CM | POA: Diagnosis not present

## 2012-06-15 DIAGNOSIS — H903 Sensorineural hearing loss, bilateral: Secondary | ICD-10-CM | POA: Diagnosis not present

## 2012-06-16 DIAGNOSIS — J189 Pneumonia, unspecified organism: Secondary | ICD-10-CM | POA: Diagnosis not present

## 2012-06-16 DIAGNOSIS — M47812 Spondylosis without myelopathy or radiculopathy, cervical region: Secondary | ICD-10-CM | POA: Diagnosis not present

## 2012-06-17 DIAGNOSIS — R262 Difficulty in walking, not elsewhere classified: Secondary | ICD-10-CM | POA: Diagnosis not present

## 2012-06-17 DIAGNOSIS — H903 Sensorineural hearing loss, bilateral: Secondary | ICD-10-CM | POA: Diagnosis not present

## 2012-06-17 DIAGNOSIS — J189 Pneumonia, unspecified organism: Secondary | ICD-10-CM | POA: Diagnosis not present

## 2012-06-17 DIAGNOSIS — J40 Bronchitis, not specified as acute or chronic: Secondary | ICD-10-CM | POA: Diagnosis not present

## 2012-06-17 DIAGNOSIS — I10 Essential (primary) hypertension: Secondary | ICD-10-CM | POA: Diagnosis not present

## 2012-06-17 DIAGNOSIS — M6281 Muscle weakness (generalized): Secondary | ICD-10-CM | POA: Diagnosis not present

## 2012-06-21 DIAGNOSIS — J40 Bronchitis, not specified as acute or chronic: Secondary | ICD-10-CM | POA: Diagnosis not present

## 2012-06-21 DIAGNOSIS — M6281 Muscle weakness (generalized): Secondary | ICD-10-CM | POA: Diagnosis not present

## 2012-06-21 DIAGNOSIS — J189 Pneumonia, unspecified organism: Secondary | ICD-10-CM | POA: Diagnosis not present

## 2012-06-21 DIAGNOSIS — H903 Sensorineural hearing loss, bilateral: Secondary | ICD-10-CM | POA: Diagnosis not present

## 2012-06-21 DIAGNOSIS — R262 Difficulty in walking, not elsewhere classified: Secondary | ICD-10-CM | POA: Diagnosis not present

## 2012-06-21 DIAGNOSIS — I10 Essential (primary) hypertension: Secondary | ICD-10-CM | POA: Diagnosis not present

## 2012-06-23 DIAGNOSIS — J40 Bronchitis, not specified as acute or chronic: Secondary | ICD-10-CM | POA: Diagnosis not present

## 2012-06-23 DIAGNOSIS — I10 Essential (primary) hypertension: Secondary | ICD-10-CM | POA: Diagnosis not present

## 2012-06-23 DIAGNOSIS — J189 Pneumonia, unspecified organism: Secondary | ICD-10-CM | POA: Diagnosis not present

## 2012-06-23 DIAGNOSIS — M6281 Muscle weakness (generalized): Secondary | ICD-10-CM | POA: Diagnosis not present

## 2012-06-23 DIAGNOSIS — R262 Difficulty in walking, not elsewhere classified: Secondary | ICD-10-CM | POA: Diagnosis not present

## 2012-06-23 DIAGNOSIS — H903 Sensorineural hearing loss, bilateral: Secondary | ICD-10-CM | POA: Diagnosis not present

## 2012-06-24 DIAGNOSIS — R262 Difficulty in walking, not elsewhere classified: Secondary | ICD-10-CM | POA: Diagnosis not present

## 2012-06-24 DIAGNOSIS — I10 Essential (primary) hypertension: Secondary | ICD-10-CM | POA: Diagnosis not present

## 2012-06-24 DIAGNOSIS — J189 Pneumonia, unspecified organism: Secondary | ICD-10-CM | POA: Diagnosis not present

## 2012-06-24 DIAGNOSIS — M6281 Muscle weakness (generalized): Secondary | ICD-10-CM | POA: Diagnosis not present

## 2012-06-24 DIAGNOSIS — J40 Bronchitis, not specified as acute or chronic: Secondary | ICD-10-CM | POA: Diagnosis not present

## 2012-06-24 DIAGNOSIS — H903 Sensorineural hearing loss, bilateral: Secondary | ICD-10-CM | POA: Diagnosis not present

## 2012-06-28 DIAGNOSIS — I10 Essential (primary) hypertension: Secondary | ICD-10-CM | POA: Diagnosis not present

## 2012-06-28 DIAGNOSIS — R262 Difficulty in walking, not elsewhere classified: Secondary | ICD-10-CM | POA: Diagnosis not present

## 2012-06-28 DIAGNOSIS — H903 Sensorineural hearing loss, bilateral: Secondary | ICD-10-CM | POA: Diagnosis not present

## 2012-06-28 DIAGNOSIS — M6281 Muscle weakness (generalized): Secondary | ICD-10-CM | POA: Diagnosis not present

## 2012-06-28 DIAGNOSIS — J189 Pneumonia, unspecified organism: Secondary | ICD-10-CM | POA: Diagnosis not present

## 2012-06-28 DIAGNOSIS — J40 Bronchitis, not specified as acute or chronic: Secondary | ICD-10-CM | POA: Diagnosis not present

## 2012-06-30 DIAGNOSIS — I10 Essential (primary) hypertension: Secondary | ICD-10-CM | POA: Diagnosis not present

## 2012-06-30 DIAGNOSIS — R262 Difficulty in walking, not elsewhere classified: Secondary | ICD-10-CM | POA: Diagnosis not present

## 2012-06-30 DIAGNOSIS — J40 Bronchitis, not specified as acute or chronic: Secondary | ICD-10-CM | POA: Diagnosis not present

## 2012-06-30 DIAGNOSIS — J189 Pneumonia, unspecified organism: Secondary | ICD-10-CM | POA: Diagnosis not present

## 2012-06-30 DIAGNOSIS — H903 Sensorineural hearing loss, bilateral: Secondary | ICD-10-CM | POA: Diagnosis not present

## 2012-06-30 DIAGNOSIS — M6281 Muscle weakness (generalized): Secondary | ICD-10-CM | POA: Diagnosis not present

## 2012-07-04 DIAGNOSIS — J189 Pneumonia, unspecified organism: Secondary | ICD-10-CM | POA: Diagnosis not present

## 2012-07-04 DIAGNOSIS — R262 Difficulty in walking, not elsewhere classified: Secondary | ICD-10-CM | POA: Diagnosis not present

## 2012-07-04 DIAGNOSIS — I10 Essential (primary) hypertension: Secondary | ICD-10-CM | POA: Diagnosis not present

## 2012-07-04 DIAGNOSIS — H903 Sensorineural hearing loss, bilateral: Secondary | ICD-10-CM | POA: Diagnosis not present

## 2012-07-04 DIAGNOSIS — M6281 Muscle weakness (generalized): Secondary | ICD-10-CM | POA: Diagnosis not present

## 2012-07-04 DIAGNOSIS — J40 Bronchitis, not specified as acute or chronic: Secondary | ICD-10-CM | POA: Diagnosis not present

## 2012-07-06 DIAGNOSIS — R262 Difficulty in walking, not elsewhere classified: Secondary | ICD-10-CM | POA: Diagnosis not present

## 2012-07-06 DIAGNOSIS — I10 Essential (primary) hypertension: Secondary | ICD-10-CM | POA: Diagnosis not present

## 2012-07-06 DIAGNOSIS — J40 Bronchitis, not specified as acute or chronic: Secondary | ICD-10-CM | POA: Diagnosis not present

## 2012-07-06 DIAGNOSIS — M6281 Muscle weakness (generalized): Secondary | ICD-10-CM | POA: Diagnosis not present

## 2012-07-06 DIAGNOSIS — H903 Sensorineural hearing loss, bilateral: Secondary | ICD-10-CM | POA: Diagnosis not present

## 2012-07-06 DIAGNOSIS — J189 Pneumonia, unspecified organism: Secondary | ICD-10-CM | POA: Diagnosis not present

## 2012-07-07 DIAGNOSIS — J479 Bronchiectasis, uncomplicated: Secondary | ICD-10-CM | POA: Diagnosis not present

## 2012-07-12 DIAGNOSIS — J40 Bronchitis, not specified as acute or chronic: Secondary | ICD-10-CM | POA: Diagnosis not present

## 2012-07-12 DIAGNOSIS — I10 Essential (primary) hypertension: Secondary | ICD-10-CM | POA: Diagnosis not present

## 2012-07-12 DIAGNOSIS — J189 Pneumonia, unspecified organism: Secondary | ICD-10-CM | POA: Diagnosis not present

## 2012-07-12 DIAGNOSIS — R262 Difficulty in walking, not elsewhere classified: Secondary | ICD-10-CM | POA: Diagnosis not present

## 2012-07-12 DIAGNOSIS — H903 Sensorineural hearing loss, bilateral: Secondary | ICD-10-CM | POA: Diagnosis not present

## 2012-07-12 DIAGNOSIS — M6281 Muscle weakness (generalized): Secondary | ICD-10-CM | POA: Diagnosis not present

## 2012-07-21 DIAGNOSIS — I10 Essential (primary) hypertension: Secondary | ICD-10-CM | POA: Diagnosis not present

## 2012-07-25 DIAGNOSIS — J189 Pneumonia, unspecified organism: Secondary | ICD-10-CM | POA: Diagnosis not present

## 2012-07-25 DIAGNOSIS — M6281 Muscle weakness (generalized): Secondary | ICD-10-CM | POA: Diagnosis not present

## 2012-07-25 DIAGNOSIS — R262 Difficulty in walking, not elsewhere classified: Secondary | ICD-10-CM | POA: Diagnosis not present

## 2012-07-25 DIAGNOSIS — M542 Cervicalgia: Secondary | ICD-10-CM | POA: Diagnosis not present

## 2012-07-26 DIAGNOSIS — J189 Pneumonia, unspecified organism: Secondary | ICD-10-CM | POA: Diagnosis not present

## 2012-07-27 DIAGNOSIS — M542 Cervicalgia: Secondary | ICD-10-CM | POA: Diagnosis not present

## 2012-07-27 DIAGNOSIS — J189 Pneumonia, unspecified organism: Secondary | ICD-10-CM | POA: Diagnosis not present

## 2012-07-27 DIAGNOSIS — R262 Difficulty in walking, not elsewhere classified: Secondary | ICD-10-CM | POA: Diagnosis not present

## 2012-07-27 DIAGNOSIS — M25519 Pain in unspecified shoulder: Secondary | ICD-10-CM | POA: Diagnosis not present

## 2012-07-27 DIAGNOSIS — M6281 Muscle weakness (generalized): Secondary | ICD-10-CM | POA: Diagnosis not present

## 2012-07-28 DIAGNOSIS — M25519 Pain in unspecified shoulder: Secondary | ICD-10-CM | POA: Diagnosis not present

## 2012-07-29 DIAGNOSIS — J189 Pneumonia, unspecified organism: Secondary | ICD-10-CM | POA: Diagnosis not present

## 2012-07-29 DIAGNOSIS — R059 Cough, unspecified: Secondary | ICD-10-CM | POA: Diagnosis not present

## 2012-07-29 DIAGNOSIS — R05 Cough: Secondary | ICD-10-CM | POA: Diagnosis not present

## 2012-07-29 DIAGNOSIS — J479 Bronchiectasis, uncomplicated: Secondary | ICD-10-CM | POA: Diagnosis not present

## 2012-08-01 DIAGNOSIS — J189 Pneumonia, unspecified organism: Secondary | ICD-10-CM | POA: Diagnosis not present

## 2012-08-01 DIAGNOSIS — M6281 Muscle weakness (generalized): Secondary | ICD-10-CM | POA: Diagnosis not present

## 2012-08-01 DIAGNOSIS — R262 Difficulty in walking, not elsewhere classified: Secondary | ICD-10-CM | POA: Diagnosis not present

## 2012-08-01 DIAGNOSIS — M542 Cervicalgia: Secondary | ICD-10-CM | POA: Diagnosis not present

## 2012-08-03 DIAGNOSIS — J189 Pneumonia, unspecified organism: Secondary | ICD-10-CM | POA: Diagnosis not present

## 2012-08-03 DIAGNOSIS — M6281 Muscle weakness (generalized): Secondary | ICD-10-CM | POA: Diagnosis not present

## 2012-08-03 DIAGNOSIS — R262 Difficulty in walking, not elsewhere classified: Secondary | ICD-10-CM | POA: Diagnosis not present

## 2012-08-03 DIAGNOSIS — M542 Cervicalgia: Secondary | ICD-10-CM | POA: Diagnosis not present

## 2012-08-08 DIAGNOSIS — J189 Pneumonia, unspecified organism: Secondary | ICD-10-CM | POA: Diagnosis not present

## 2012-08-08 DIAGNOSIS — M542 Cervicalgia: Secondary | ICD-10-CM | POA: Diagnosis not present

## 2012-08-08 DIAGNOSIS — M6281 Muscle weakness (generalized): Secondary | ICD-10-CM | POA: Diagnosis not present

## 2012-08-08 DIAGNOSIS — R262 Difficulty in walking, not elsewhere classified: Secondary | ICD-10-CM | POA: Diagnosis not present

## 2012-08-10 DIAGNOSIS — R262 Difficulty in walking, not elsewhere classified: Secondary | ICD-10-CM | POA: Diagnosis not present

## 2012-08-10 DIAGNOSIS — J189 Pneumonia, unspecified organism: Secondary | ICD-10-CM | POA: Diagnosis not present

## 2012-08-10 DIAGNOSIS — M542 Cervicalgia: Secondary | ICD-10-CM | POA: Diagnosis not present

## 2012-08-10 DIAGNOSIS — M6281 Muscle weakness (generalized): Secondary | ICD-10-CM | POA: Diagnosis not present

## 2012-08-15 DIAGNOSIS — J189 Pneumonia, unspecified organism: Secondary | ICD-10-CM | POA: Diagnosis not present

## 2012-08-15 DIAGNOSIS — R262 Difficulty in walking, not elsewhere classified: Secondary | ICD-10-CM | POA: Diagnosis not present

## 2012-08-15 DIAGNOSIS — M6281 Muscle weakness (generalized): Secondary | ICD-10-CM | POA: Diagnosis not present

## 2012-08-15 DIAGNOSIS — M542 Cervicalgia: Secondary | ICD-10-CM | POA: Diagnosis not present

## 2012-08-17 DIAGNOSIS — R262 Difficulty in walking, not elsewhere classified: Secondary | ICD-10-CM | POA: Diagnosis not present

## 2012-08-17 DIAGNOSIS — M6281 Muscle weakness (generalized): Secondary | ICD-10-CM | POA: Diagnosis not present

## 2012-08-17 DIAGNOSIS — M542 Cervicalgia: Secondary | ICD-10-CM | POA: Diagnosis not present

## 2012-08-17 DIAGNOSIS — J189 Pneumonia, unspecified organism: Secondary | ICD-10-CM | POA: Diagnosis not present

## 2012-08-23 DIAGNOSIS — R262 Difficulty in walking, not elsewhere classified: Secondary | ICD-10-CM | POA: Diagnosis not present

## 2012-08-23 DIAGNOSIS — J189 Pneumonia, unspecified organism: Secondary | ICD-10-CM | POA: Diagnosis not present

## 2012-08-23 DIAGNOSIS — M542 Cervicalgia: Secondary | ICD-10-CM | POA: Diagnosis not present

## 2012-08-23 DIAGNOSIS — M6281 Muscle weakness (generalized): Secondary | ICD-10-CM | POA: Diagnosis not present

## 2012-08-25 DIAGNOSIS — M6281 Muscle weakness (generalized): Secondary | ICD-10-CM | POA: Diagnosis not present

## 2012-08-25 DIAGNOSIS — M542 Cervicalgia: Secondary | ICD-10-CM | POA: Diagnosis not present

## 2012-08-25 DIAGNOSIS — R262 Difficulty in walking, not elsewhere classified: Secondary | ICD-10-CM | POA: Diagnosis not present

## 2012-08-25 DIAGNOSIS — J189 Pneumonia, unspecified organism: Secondary | ICD-10-CM | POA: Diagnosis not present

## 2012-08-29 DIAGNOSIS — M6281 Muscle weakness (generalized): Secondary | ICD-10-CM | POA: Diagnosis not present

## 2012-08-29 DIAGNOSIS — R262 Difficulty in walking, not elsewhere classified: Secondary | ICD-10-CM | POA: Diagnosis not present

## 2012-08-29 DIAGNOSIS — J189 Pneumonia, unspecified organism: Secondary | ICD-10-CM | POA: Diagnosis not present

## 2012-08-29 DIAGNOSIS — M542 Cervicalgia: Secondary | ICD-10-CM | POA: Diagnosis not present

## 2012-08-30 DIAGNOSIS — M25519 Pain in unspecified shoulder: Secondary | ICD-10-CM | POA: Diagnosis not present

## 2012-08-31 DIAGNOSIS — J189 Pneumonia, unspecified organism: Secondary | ICD-10-CM | POA: Diagnosis not present

## 2012-08-31 DIAGNOSIS — M542 Cervicalgia: Secondary | ICD-10-CM | POA: Diagnosis not present

## 2012-08-31 DIAGNOSIS — R262 Difficulty in walking, not elsewhere classified: Secondary | ICD-10-CM | POA: Diagnosis not present

## 2012-08-31 DIAGNOSIS — M6281 Muscle weakness (generalized): Secondary | ICD-10-CM | POA: Diagnosis not present

## 2012-09-07 DIAGNOSIS — J189 Pneumonia, unspecified organism: Secondary | ICD-10-CM | POA: Diagnosis not present

## 2012-09-07 DIAGNOSIS — R262 Difficulty in walking, not elsewhere classified: Secondary | ICD-10-CM | POA: Diagnosis not present

## 2012-09-07 DIAGNOSIS — M542 Cervicalgia: Secondary | ICD-10-CM | POA: Diagnosis not present

## 2012-09-07 DIAGNOSIS — M6281 Muscle weakness (generalized): Secondary | ICD-10-CM | POA: Diagnosis not present

## 2012-09-08 DIAGNOSIS — R262 Difficulty in walking, not elsewhere classified: Secondary | ICD-10-CM | POA: Diagnosis not present

## 2012-09-08 DIAGNOSIS — M6281 Muscle weakness (generalized): Secondary | ICD-10-CM | POA: Diagnosis not present

## 2012-09-08 DIAGNOSIS — M542 Cervicalgia: Secondary | ICD-10-CM | POA: Diagnosis not present

## 2012-09-08 DIAGNOSIS — J189 Pneumonia, unspecified organism: Secondary | ICD-10-CM | POA: Diagnosis not present

## 2012-09-15 DIAGNOSIS — J479 Bronchiectasis, uncomplicated: Secondary | ICD-10-CM | POA: Diagnosis not present

## 2012-09-20 DIAGNOSIS — N183 Chronic kidney disease, stage 3 unspecified: Secondary | ICD-10-CM | POA: Diagnosis not present

## 2012-09-20 DIAGNOSIS — R609 Edema, unspecified: Secondary | ICD-10-CM | POA: Diagnosis not present

## 2012-09-20 DIAGNOSIS — J45909 Unspecified asthma, uncomplicated: Secondary | ICD-10-CM | POA: Diagnosis not present

## 2012-09-22 DIAGNOSIS — W010XXA Fall on same level from slipping, tripping and stumbling without subsequent striking against object, initial encounter: Secondary | ICD-10-CM | POA: Diagnosis not present

## 2012-09-22 DIAGNOSIS — S0003XA Contusion of scalp, initial encounter: Secondary | ICD-10-CM | POA: Diagnosis not present

## 2012-09-22 DIAGNOSIS — R52 Pain, unspecified: Secondary | ICD-10-CM | POA: Diagnosis not present

## 2012-09-22 DIAGNOSIS — E785 Hyperlipidemia, unspecified: Secondary | ICD-10-CM | POA: Diagnosis not present

## 2012-09-22 DIAGNOSIS — Z87891 Personal history of nicotine dependence: Secondary | ICD-10-CM | POA: Diagnosis not present

## 2012-09-22 DIAGNOSIS — R51 Headache: Secondary | ICD-10-CM | POA: Diagnosis not present

## 2012-09-22 DIAGNOSIS — M129 Arthropathy, unspecified: Secondary | ICD-10-CM | POA: Diagnosis not present

## 2012-09-22 DIAGNOSIS — S6990XA Unspecified injury of unspecified wrist, hand and finger(s), initial encounter: Secondary | ICD-10-CM | POA: Diagnosis not present

## 2012-09-22 DIAGNOSIS — Z87440 Personal history of urinary (tract) infections: Secondary | ICD-10-CM | POA: Diagnosis not present

## 2012-09-22 DIAGNOSIS — M25569 Pain in unspecified knee: Secondary | ICD-10-CM | POA: Diagnosis not present

## 2012-09-22 DIAGNOSIS — Z859 Personal history of malignant neoplasm, unspecified: Secondary | ICD-10-CM | POA: Diagnosis not present

## 2012-09-22 DIAGNOSIS — M25539 Pain in unspecified wrist: Secondary | ICD-10-CM | POA: Diagnosis not present

## 2012-09-22 DIAGNOSIS — M542 Cervicalgia: Secondary | ICD-10-CM | POA: Diagnosis not present

## 2012-09-22 DIAGNOSIS — Z888 Allergy status to other drugs, medicaments and biological substances status: Secondary | ICD-10-CM | POA: Diagnosis not present

## 2012-09-22 DIAGNOSIS — IMO0002 Reserved for concepts with insufficient information to code with codable children: Secondary | ICD-10-CM | POA: Diagnosis not present

## 2012-09-22 DIAGNOSIS — T07XXXA Unspecified multiple injuries, initial encounter: Secondary | ICD-10-CM | POA: Diagnosis not present

## 2012-09-22 DIAGNOSIS — S61509A Unspecified open wound of unspecified wrist, initial encounter: Secondary | ICD-10-CM | POA: Diagnosis not present

## 2012-09-22 DIAGNOSIS — S60229A Contusion of unspecified hand, initial encounter: Secondary | ICD-10-CM | POA: Diagnosis not present

## 2012-09-22 DIAGNOSIS — Z79899 Other long term (current) drug therapy: Secondary | ICD-10-CM | POA: Diagnosis not present

## 2012-09-27 DIAGNOSIS — S61509A Unspecified open wound of unspecified wrist, initial encounter: Secondary | ICD-10-CM | POA: Diagnosis not present

## 2012-09-27 DIAGNOSIS — T07XXXA Unspecified multiple injuries, initial encounter: Secondary | ICD-10-CM | POA: Diagnosis not present

## 2012-09-27 DIAGNOSIS — IMO0002 Reserved for concepts with insufficient information to code with codable children: Secondary | ICD-10-CM | POA: Diagnosis not present

## 2012-10-11 DIAGNOSIS — R609 Edema, unspecified: Secondary | ICD-10-CM | POA: Diagnosis not present

## 2012-10-17 DIAGNOSIS — R279 Unspecified lack of coordination: Secondary | ICD-10-CM | POA: Diagnosis not present

## 2012-10-17 DIAGNOSIS — Z9181 History of falling: Secondary | ICD-10-CM | POA: Diagnosis not present

## 2012-10-17 DIAGNOSIS — M6281 Muscle weakness (generalized): Secondary | ICD-10-CM | POA: Diagnosis not present

## 2012-10-17 DIAGNOSIS — R262 Difficulty in walking, not elsewhere classified: Secondary | ICD-10-CM | POA: Diagnosis not present

## 2012-10-17 DIAGNOSIS — J449 Chronic obstructive pulmonary disease, unspecified: Secondary | ICD-10-CM | POA: Diagnosis not present

## 2012-10-18 DIAGNOSIS — R609 Edema, unspecified: Secondary | ICD-10-CM | POA: Diagnosis not present

## 2012-10-18 DIAGNOSIS — Z9181 History of falling: Secondary | ICD-10-CM | POA: Diagnosis not present

## 2012-10-18 DIAGNOSIS — S51009A Unspecified open wound of unspecified elbow, initial encounter: Secondary | ICD-10-CM | POA: Diagnosis not present

## 2012-10-18 DIAGNOSIS — K5289 Other specified noninfective gastroenteritis and colitis: Secondary | ICD-10-CM | POA: Diagnosis not present

## 2012-10-20 DIAGNOSIS — R262 Difficulty in walking, not elsewhere classified: Secondary | ICD-10-CM | POA: Diagnosis not present

## 2012-10-20 DIAGNOSIS — R279 Unspecified lack of coordination: Secondary | ICD-10-CM | POA: Diagnosis not present

## 2012-10-20 DIAGNOSIS — H903 Sensorineural hearing loss, bilateral: Secondary | ICD-10-CM | POA: Diagnosis not present

## 2012-10-20 DIAGNOSIS — Z9181 History of falling: Secondary | ICD-10-CM | POA: Diagnosis not present

## 2012-10-20 DIAGNOSIS — J449 Chronic obstructive pulmonary disease, unspecified: Secondary | ICD-10-CM | POA: Diagnosis not present

## 2012-10-20 DIAGNOSIS — M6281 Muscle weakness (generalized): Secondary | ICD-10-CM | POA: Diagnosis not present

## 2012-10-20 DIAGNOSIS — H905 Unspecified sensorineural hearing loss: Secondary | ICD-10-CM | POA: Diagnosis not present

## 2012-10-24 DIAGNOSIS — R262 Difficulty in walking, not elsewhere classified: Secondary | ICD-10-CM | POA: Diagnosis not present

## 2012-10-24 DIAGNOSIS — Z9181 History of falling: Secondary | ICD-10-CM | POA: Diagnosis not present

## 2012-10-24 DIAGNOSIS — M6281 Muscle weakness (generalized): Secondary | ICD-10-CM | POA: Diagnosis not present

## 2012-10-24 DIAGNOSIS — J449 Chronic obstructive pulmonary disease, unspecified: Secondary | ICD-10-CM | POA: Diagnosis not present

## 2012-10-24 DIAGNOSIS — R279 Unspecified lack of coordination: Secondary | ICD-10-CM | POA: Diagnosis not present

## 2012-10-25 DIAGNOSIS — H903 Sensorineural hearing loss, bilateral: Secondary | ICD-10-CM | POA: Diagnosis not present

## 2012-10-26 DIAGNOSIS — H903 Sensorineural hearing loss, bilateral: Secondary | ICD-10-CM | POA: Diagnosis not present

## 2012-10-27 DIAGNOSIS — Z9181 History of falling: Secondary | ICD-10-CM | POA: Diagnosis not present

## 2012-10-27 DIAGNOSIS — R262 Difficulty in walking, not elsewhere classified: Secondary | ICD-10-CM | POA: Diagnosis not present

## 2012-10-27 DIAGNOSIS — R279 Unspecified lack of coordination: Secondary | ICD-10-CM | POA: Diagnosis not present

## 2012-10-27 DIAGNOSIS — J449 Chronic obstructive pulmonary disease, unspecified: Secondary | ICD-10-CM | POA: Diagnosis not present

## 2012-10-27 DIAGNOSIS — M6281 Muscle weakness (generalized): Secondary | ICD-10-CM | POA: Diagnosis not present

## 2012-10-28 DIAGNOSIS — R059 Cough, unspecified: Secondary | ICD-10-CM | POA: Diagnosis not present

## 2012-10-28 DIAGNOSIS — R079 Chest pain, unspecified: Secondary | ICD-10-CM | POA: Diagnosis not present

## 2012-10-28 DIAGNOSIS — E785 Hyperlipidemia, unspecified: Secondary | ICD-10-CM | POA: Diagnosis not present

## 2012-10-28 DIAGNOSIS — J189 Pneumonia, unspecified organism: Secondary | ICD-10-CM | POA: Diagnosis not present

## 2012-10-28 DIAGNOSIS — Z87891 Personal history of nicotine dependence: Secondary | ICD-10-CM | POA: Diagnosis not present

## 2012-10-28 DIAGNOSIS — Z79899 Other long term (current) drug therapy: Secondary | ICD-10-CM | POA: Diagnosis not present

## 2012-10-28 DIAGNOSIS — Z9089 Acquired absence of other organs: Secondary | ICD-10-CM | POA: Diagnosis not present

## 2012-10-28 DIAGNOSIS — IMO0002 Reserved for concepts with insufficient information to code with codable children: Secondary | ICD-10-CM | POA: Diagnosis not present

## 2012-10-28 DIAGNOSIS — H9190 Unspecified hearing loss, unspecified ear: Secondary | ICD-10-CM | POA: Diagnosis not present

## 2012-10-28 DIAGNOSIS — R05 Cough: Secondary | ICD-10-CM | POA: Diagnosis not present

## 2012-10-28 DIAGNOSIS — M129 Arthropathy, unspecified: Secondary | ICD-10-CM | POA: Diagnosis not present

## 2012-10-28 DIAGNOSIS — I447 Left bundle-branch block, unspecified: Secondary | ICD-10-CM | POA: Diagnosis not present

## 2012-11-01 DIAGNOSIS — J189 Pneumonia, unspecified organism: Secondary | ICD-10-CM | POA: Diagnosis not present

## 2012-11-02 DIAGNOSIS — Z9181 History of falling: Secondary | ICD-10-CM | POA: Diagnosis not present

## 2012-11-02 DIAGNOSIS — M6281 Muscle weakness (generalized): Secondary | ICD-10-CM | POA: Diagnosis not present

## 2012-11-02 DIAGNOSIS — R262 Difficulty in walking, not elsewhere classified: Secondary | ICD-10-CM | POA: Diagnosis not present

## 2012-11-02 DIAGNOSIS — J449 Chronic obstructive pulmonary disease, unspecified: Secondary | ICD-10-CM | POA: Diagnosis not present

## 2012-11-02 DIAGNOSIS — R279 Unspecified lack of coordination: Secondary | ICD-10-CM | POA: Diagnosis not present

## 2012-11-03 DIAGNOSIS — I808 Phlebitis and thrombophlebitis of other sites: Secondary | ICD-10-CM | POA: Diagnosis not present

## 2012-11-03 DIAGNOSIS — Z79899 Other long term (current) drug therapy: Secondary | ICD-10-CM | POA: Diagnosis not present

## 2012-11-03 DIAGNOSIS — Z859 Personal history of malignant neoplasm, unspecified: Secondary | ICD-10-CM | POA: Diagnosis not present

## 2012-11-03 DIAGNOSIS — M25539 Pain in unspecified wrist: Secondary | ICD-10-CM | POA: Diagnosis not present

## 2012-11-03 DIAGNOSIS — Z87891 Personal history of nicotine dependence: Secondary | ICD-10-CM | POA: Diagnosis not present

## 2012-11-03 DIAGNOSIS — IMO0002 Reserved for concepts with insufficient information to code with codable children: Secondary | ICD-10-CM | POA: Diagnosis not present

## 2012-11-03 DIAGNOSIS — I809 Phlebitis and thrombophlebitis of unspecified site: Secondary | ICD-10-CM | POA: Diagnosis not present

## 2012-11-03 DIAGNOSIS — I998 Other disorder of circulatory system: Secondary | ICD-10-CM | POA: Diagnosis not present

## 2012-11-03 DIAGNOSIS — Z888 Allergy status to other drugs, medicaments and biological substances status: Secondary | ICD-10-CM | POA: Diagnosis not present

## 2012-11-03 DIAGNOSIS — J479 Bronchiectasis, uncomplicated: Secondary | ICD-10-CM | POA: Diagnosis not present

## 2012-11-03 DIAGNOSIS — E785 Hyperlipidemia, unspecified: Secondary | ICD-10-CM | POA: Diagnosis not present

## 2012-11-03 DIAGNOSIS — M129 Arthropathy, unspecified: Secondary | ICD-10-CM | POA: Diagnosis not present

## 2012-11-07 DIAGNOSIS — M6281 Muscle weakness (generalized): Secondary | ICD-10-CM | POA: Diagnosis not present

## 2012-11-07 DIAGNOSIS — R262 Difficulty in walking, not elsewhere classified: Secondary | ICD-10-CM | POA: Diagnosis not present

## 2012-11-07 DIAGNOSIS — R279 Unspecified lack of coordination: Secondary | ICD-10-CM | POA: Diagnosis not present

## 2012-11-07 DIAGNOSIS — Z9181 History of falling: Secondary | ICD-10-CM | POA: Diagnosis not present

## 2012-11-07 DIAGNOSIS — J449 Chronic obstructive pulmonary disease, unspecified: Secondary | ICD-10-CM | POA: Diagnosis not present

## 2012-11-08 DIAGNOSIS — M79609 Pain in unspecified limb: Secondary | ICD-10-CM | POA: Diagnosis not present

## 2012-11-08 DIAGNOSIS — I808 Phlebitis and thrombophlebitis of other sites: Secondary | ICD-10-CM | POA: Diagnosis not present

## 2012-11-08 DIAGNOSIS — J189 Pneumonia, unspecified organism: Secondary | ICD-10-CM | POA: Diagnosis not present

## 2012-11-09 DIAGNOSIS — R279 Unspecified lack of coordination: Secondary | ICD-10-CM | POA: Diagnosis not present

## 2012-11-09 DIAGNOSIS — R262 Difficulty in walking, not elsewhere classified: Secondary | ICD-10-CM | POA: Diagnosis not present

## 2012-11-09 DIAGNOSIS — J449 Chronic obstructive pulmonary disease, unspecified: Secondary | ICD-10-CM | POA: Diagnosis not present

## 2012-11-09 DIAGNOSIS — M6281 Muscle weakness (generalized): Secondary | ICD-10-CM | POA: Diagnosis not present

## 2012-11-09 DIAGNOSIS — Z9181 History of falling: Secondary | ICD-10-CM | POA: Diagnosis not present

## 2012-11-14 DIAGNOSIS — J449 Chronic obstructive pulmonary disease, unspecified: Secondary | ICD-10-CM | POA: Diagnosis not present

## 2012-11-14 DIAGNOSIS — R279 Unspecified lack of coordination: Secondary | ICD-10-CM | POA: Diagnosis not present

## 2012-11-14 DIAGNOSIS — M6281 Muscle weakness (generalized): Secondary | ICD-10-CM | POA: Diagnosis not present

## 2012-11-14 DIAGNOSIS — R262 Difficulty in walking, not elsewhere classified: Secondary | ICD-10-CM | POA: Diagnosis not present

## 2012-11-14 DIAGNOSIS — Z9181 History of falling: Secondary | ICD-10-CM | POA: Diagnosis not present

## 2012-11-15 DIAGNOSIS — Z1231 Encounter for screening mammogram for malignant neoplasm of breast: Secondary | ICD-10-CM | POA: Diagnosis not present

## 2012-11-16 DIAGNOSIS — J449 Chronic obstructive pulmonary disease, unspecified: Secondary | ICD-10-CM | POA: Diagnosis not present

## 2012-11-16 DIAGNOSIS — Z9181 History of falling: Secondary | ICD-10-CM | POA: Diagnosis not present

## 2012-11-16 DIAGNOSIS — M6281 Muscle weakness (generalized): Secondary | ICD-10-CM | POA: Diagnosis not present

## 2012-11-16 DIAGNOSIS — R262 Difficulty in walking, not elsewhere classified: Secondary | ICD-10-CM | POA: Diagnosis not present

## 2012-11-16 DIAGNOSIS — R279 Unspecified lack of coordination: Secondary | ICD-10-CM | POA: Diagnosis not present

## 2012-11-18 DIAGNOSIS — M25559 Pain in unspecified hip: Secondary | ICD-10-CM | POA: Diagnosis not present

## 2012-11-18 DIAGNOSIS — Z79899 Other long term (current) drug therapy: Secondary | ICD-10-CM | POA: Diagnosis not present

## 2012-11-18 DIAGNOSIS — W06XXXA Fall from bed, initial encounter: Secondary | ICD-10-CM | POA: Diagnosis not present

## 2012-11-18 DIAGNOSIS — M25569 Pain in unspecified knee: Secondary | ICD-10-CM | POA: Diagnosis not present

## 2012-11-18 DIAGNOSIS — Z87891 Personal history of nicotine dependence: Secondary | ICD-10-CM | POA: Diagnosis not present

## 2012-11-18 DIAGNOSIS — IMO0002 Reserved for concepts with insufficient information to code with codable children: Secondary | ICD-10-CM | POA: Diagnosis not present

## 2012-11-18 DIAGNOSIS — M79609 Pain in unspecified limb: Secondary | ICD-10-CM | POA: Diagnosis not present

## 2012-11-22 DIAGNOSIS — J449 Chronic obstructive pulmonary disease, unspecified: Secondary | ICD-10-CM | POA: Diagnosis not present

## 2012-11-22 DIAGNOSIS — R262 Difficulty in walking, not elsewhere classified: Secondary | ICD-10-CM | POA: Diagnosis not present

## 2012-11-22 DIAGNOSIS — R279 Unspecified lack of coordination: Secondary | ICD-10-CM | POA: Diagnosis not present

## 2012-11-22 DIAGNOSIS — M6281 Muscle weakness (generalized): Secondary | ICD-10-CM | POA: Diagnosis not present

## 2012-11-22 DIAGNOSIS — Z9181 History of falling: Secondary | ICD-10-CM | POA: Diagnosis not present

## 2012-11-24 DIAGNOSIS — R279 Unspecified lack of coordination: Secondary | ICD-10-CM | POA: Diagnosis not present

## 2012-11-24 DIAGNOSIS — J449 Chronic obstructive pulmonary disease, unspecified: Secondary | ICD-10-CM | POA: Diagnosis not present

## 2012-11-24 DIAGNOSIS — Z9181 History of falling: Secondary | ICD-10-CM | POA: Diagnosis not present

## 2012-11-24 DIAGNOSIS — R262 Difficulty in walking, not elsewhere classified: Secondary | ICD-10-CM | POA: Diagnosis not present

## 2012-11-24 DIAGNOSIS — M6281 Muscle weakness (generalized): Secondary | ICD-10-CM | POA: Diagnosis not present

## 2012-11-25 DIAGNOSIS — E785 Hyperlipidemia, unspecified: Secondary | ICD-10-CM | POA: Diagnosis not present

## 2012-11-25 DIAGNOSIS — M129 Arthropathy, unspecified: Secondary | ICD-10-CM | POA: Diagnosis not present

## 2012-11-25 DIAGNOSIS — R5381 Other malaise: Secondary | ICD-10-CM | POA: Diagnosis not present

## 2012-11-25 DIAGNOSIS — Z87891 Personal history of nicotine dependence: Secondary | ICD-10-CM | POA: Diagnosis not present

## 2012-11-25 DIAGNOSIS — Z79899 Other long term (current) drug therapy: Secondary | ICD-10-CM | POA: Diagnosis not present

## 2012-11-25 DIAGNOSIS — M79609 Pain in unspecified limb: Secondary | ICD-10-CM | POA: Diagnosis not present

## 2012-11-25 DIAGNOSIS — IMO0001 Reserved for inherently not codable concepts without codable children: Secondary | ICD-10-CM | POA: Diagnosis not present

## 2012-11-29 DIAGNOSIS — R262 Difficulty in walking, not elsewhere classified: Secondary | ICD-10-CM | POA: Diagnosis not present

## 2012-11-29 DIAGNOSIS — R279 Unspecified lack of coordination: Secondary | ICD-10-CM | POA: Diagnosis not present

## 2012-11-29 DIAGNOSIS — IMO0001 Reserved for inherently not codable concepts without codable children: Secondary | ICD-10-CM | POA: Diagnosis not present

## 2012-11-29 DIAGNOSIS — Z9181 History of falling: Secondary | ICD-10-CM | POA: Diagnosis not present

## 2012-11-29 DIAGNOSIS — M6281 Muscle weakness (generalized): Secondary | ICD-10-CM | POA: Diagnosis not present

## 2012-11-29 DIAGNOSIS — J449 Chronic obstructive pulmonary disease, unspecified: Secondary | ICD-10-CM | POA: Diagnosis not present

## 2012-12-02 DIAGNOSIS — M6281 Muscle weakness (generalized): Secondary | ICD-10-CM | POA: Diagnosis not present

## 2012-12-02 DIAGNOSIS — J449 Chronic obstructive pulmonary disease, unspecified: Secondary | ICD-10-CM | POA: Diagnosis not present

## 2012-12-02 DIAGNOSIS — R279 Unspecified lack of coordination: Secondary | ICD-10-CM | POA: Diagnosis not present

## 2012-12-02 DIAGNOSIS — Z9181 History of falling: Secondary | ICD-10-CM | POA: Diagnosis not present

## 2012-12-02 DIAGNOSIS — R262 Difficulty in walking, not elsewhere classified: Secondary | ICD-10-CM | POA: Diagnosis not present

## 2012-12-06 DIAGNOSIS — Z23 Encounter for immunization: Secondary | ICD-10-CM | POA: Diagnosis not present

## 2012-12-06 DIAGNOSIS — J479 Bronchiectasis, uncomplicated: Secondary | ICD-10-CM | POA: Diagnosis not present

## 2012-12-06 DIAGNOSIS — J45909 Unspecified asthma, uncomplicated: Secondary | ICD-10-CM | POA: Diagnosis not present

## 2012-12-07 DIAGNOSIS — J449 Chronic obstructive pulmonary disease, unspecified: Secondary | ICD-10-CM | POA: Diagnosis not present

## 2012-12-07 DIAGNOSIS — M6281 Muscle weakness (generalized): Secondary | ICD-10-CM | POA: Diagnosis not present

## 2012-12-07 DIAGNOSIS — R262 Difficulty in walking, not elsewhere classified: Secondary | ICD-10-CM | POA: Diagnosis not present

## 2012-12-07 DIAGNOSIS — Z9181 History of falling: Secondary | ICD-10-CM | POA: Diagnosis not present

## 2012-12-07 DIAGNOSIS — R279 Unspecified lack of coordination: Secondary | ICD-10-CM | POA: Diagnosis not present

## 2012-12-08 DIAGNOSIS — M1610 Unilateral primary osteoarthritis, unspecified hip: Secondary | ICD-10-CM | POA: Diagnosis not present

## 2012-12-08 DIAGNOSIS — Z9181 History of falling: Secondary | ICD-10-CM | POA: Diagnosis not present

## 2012-12-08 DIAGNOSIS — M169 Osteoarthritis of hip, unspecified: Secondary | ICD-10-CM | POA: Diagnosis not present

## 2012-12-08 DIAGNOSIS — M6281 Muscle weakness (generalized): Secondary | ICD-10-CM | POA: Diagnosis not present

## 2012-12-08 DIAGNOSIS — R262 Difficulty in walking, not elsewhere classified: Secondary | ICD-10-CM | POA: Diagnosis not present

## 2012-12-08 DIAGNOSIS — R279 Unspecified lack of coordination: Secondary | ICD-10-CM | POA: Diagnosis not present

## 2012-12-08 DIAGNOSIS — J449 Chronic obstructive pulmonary disease, unspecified: Secondary | ICD-10-CM | POA: Diagnosis not present

## 2012-12-08 DIAGNOSIS — M25559 Pain in unspecified hip: Secondary | ICD-10-CM | POA: Diagnosis not present

## 2012-12-13 DIAGNOSIS — J449 Chronic obstructive pulmonary disease, unspecified: Secondary | ICD-10-CM | POA: Diagnosis not present

## 2012-12-13 DIAGNOSIS — M6281 Muscle weakness (generalized): Secondary | ICD-10-CM | POA: Diagnosis not present

## 2012-12-13 DIAGNOSIS — R262 Difficulty in walking, not elsewhere classified: Secondary | ICD-10-CM | POA: Diagnosis not present

## 2012-12-13 DIAGNOSIS — Z9181 History of falling: Secondary | ICD-10-CM | POA: Diagnosis not present

## 2012-12-13 DIAGNOSIS — R279 Unspecified lack of coordination: Secondary | ICD-10-CM | POA: Diagnosis not present

## 2012-12-14 DIAGNOSIS — R279 Unspecified lack of coordination: Secondary | ICD-10-CM | POA: Diagnosis not present

## 2012-12-14 DIAGNOSIS — J449 Chronic obstructive pulmonary disease, unspecified: Secondary | ICD-10-CM | POA: Diagnosis not present

## 2012-12-14 DIAGNOSIS — R262 Difficulty in walking, not elsewhere classified: Secondary | ICD-10-CM | POA: Diagnosis not present

## 2012-12-14 DIAGNOSIS — Z9181 History of falling: Secondary | ICD-10-CM | POA: Diagnosis not present

## 2012-12-14 DIAGNOSIS — M6281 Muscle weakness (generalized): Secondary | ICD-10-CM | POA: Diagnosis not present

## 2012-12-20 DIAGNOSIS — J449 Chronic obstructive pulmonary disease, unspecified: Secondary | ICD-10-CM | POA: Diagnosis not present

## 2012-12-20 DIAGNOSIS — R279 Unspecified lack of coordination: Secondary | ICD-10-CM | POA: Diagnosis not present

## 2012-12-20 DIAGNOSIS — M6281 Muscle weakness (generalized): Secondary | ICD-10-CM | POA: Diagnosis not present

## 2012-12-20 DIAGNOSIS — Z9181 History of falling: Secondary | ICD-10-CM | POA: Diagnosis not present

## 2012-12-20 DIAGNOSIS — R262 Difficulty in walking, not elsewhere classified: Secondary | ICD-10-CM | POA: Diagnosis not present

## 2012-12-21 DIAGNOSIS — Z9181 History of falling: Secondary | ICD-10-CM | POA: Diagnosis not present

## 2012-12-21 DIAGNOSIS — M6281 Muscle weakness (generalized): Secondary | ICD-10-CM | POA: Diagnosis not present

## 2012-12-21 DIAGNOSIS — R279 Unspecified lack of coordination: Secondary | ICD-10-CM | POA: Diagnosis not present

## 2012-12-21 DIAGNOSIS — R262 Difficulty in walking, not elsewhere classified: Secondary | ICD-10-CM | POA: Diagnosis not present

## 2012-12-21 DIAGNOSIS — J449 Chronic obstructive pulmonary disease, unspecified: Secondary | ICD-10-CM | POA: Diagnosis not present

## 2012-12-22 DIAGNOSIS — M1710 Unilateral primary osteoarthritis, unspecified knee: Secondary | ICD-10-CM | POA: Diagnosis not present

## 2012-12-22 DIAGNOSIS — M25559 Pain in unspecified hip: Secondary | ICD-10-CM | POA: Diagnosis not present

## 2012-12-22 DIAGNOSIS — M1610 Unilateral primary osteoarthritis, unspecified hip: Secondary | ICD-10-CM | POA: Diagnosis not present

## 2012-12-22 DIAGNOSIS — M25519 Pain in unspecified shoulder: Secondary | ICD-10-CM | POA: Diagnosis not present

## 2012-12-22 DIAGNOSIS — M169 Osteoarthritis of hip, unspecified: Secondary | ICD-10-CM | POA: Diagnosis not present

## 2012-12-29 DIAGNOSIS — R279 Unspecified lack of coordination: Secondary | ICD-10-CM | POA: Diagnosis not present

## 2012-12-29 DIAGNOSIS — J449 Chronic obstructive pulmonary disease, unspecified: Secondary | ICD-10-CM | POA: Diagnosis not present

## 2012-12-29 DIAGNOSIS — R262 Difficulty in walking, not elsewhere classified: Secondary | ICD-10-CM | POA: Diagnosis not present

## 2012-12-29 DIAGNOSIS — M6281 Muscle weakness (generalized): Secondary | ICD-10-CM | POA: Diagnosis not present

## 2012-12-29 DIAGNOSIS — Z9181 History of falling: Secondary | ICD-10-CM | POA: Diagnosis not present

## 2013-01-02 DIAGNOSIS — R279 Unspecified lack of coordination: Secondary | ICD-10-CM | POA: Diagnosis not present

## 2013-01-02 DIAGNOSIS — M2550 Pain in unspecified joint: Secondary | ICD-10-CM | POA: Diagnosis not present

## 2013-01-02 DIAGNOSIS — J449 Chronic obstructive pulmonary disease, unspecified: Secondary | ICD-10-CM | POA: Diagnosis not present

## 2013-01-02 DIAGNOSIS — Z9181 History of falling: Secondary | ICD-10-CM | POA: Diagnosis not present

## 2013-01-02 DIAGNOSIS — M6281 Muscle weakness (generalized): Secondary | ICD-10-CM | POA: Diagnosis not present

## 2013-01-02 DIAGNOSIS — R262 Difficulty in walking, not elsewhere classified: Secondary | ICD-10-CM | POA: Diagnosis not present

## 2013-01-02 DIAGNOSIS — M159 Polyosteoarthritis, unspecified: Secondary | ICD-10-CM | POA: Diagnosis not present

## 2013-01-03 DIAGNOSIS — M353 Polymyalgia rheumatica: Secondary | ICD-10-CM | POA: Diagnosis not present

## 2013-01-03 DIAGNOSIS — R279 Unspecified lack of coordination: Secondary | ICD-10-CM | POA: Diagnosis not present

## 2013-01-03 DIAGNOSIS — R262 Difficulty in walking, not elsewhere classified: Secondary | ICD-10-CM | POA: Diagnosis not present

## 2013-01-03 DIAGNOSIS — M6281 Muscle weakness (generalized): Secondary | ICD-10-CM | POA: Diagnosis not present

## 2013-01-03 DIAGNOSIS — M2550 Pain in unspecified joint: Secondary | ICD-10-CM | POA: Diagnosis not present

## 2013-01-03 DIAGNOSIS — Z9181 History of falling: Secondary | ICD-10-CM | POA: Diagnosis not present

## 2013-01-03 DIAGNOSIS — M159 Polyosteoarthritis, unspecified: Secondary | ICD-10-CM | POA: Diagnosis not present

## 2013-01-04 DIAGNOSIS — R262 Difficulty in walking, not elsewhere classified: Secondary | ICD-10-CM | POA: Diagnosis not present

## 2013-01-04 DIAGNOSIS — Z9181 History of falling: Secondary | ICD-10-CM | POA: Diagnosis not present

## 2013-01-04 DIAGNOSIS — R279 Unspecified lack of coordination: Secondary | ICD-10-CM | POA: Diagnosis not present

## 2013-01-04 DIAGNOSIS — M6281 Muscle weakness (generalized): Secondary | ICD-10-CM | POA: Diagnosis not present

## 2013-01-04 DIAGNOSIS — M159 Polyosteoarthritis, unspecified: Secondary | ICD-10-CM | POA: Diagnosis not present

## 2013-01-04 DIAGNOSIS — M2550 Pain in unspecified joint: Secondary | ICD-10-CM | POA: Diagnosis not present

## 2013-01-05 DIAGNOSIS — M6281 Muscle weakness (generalized): Secondary | ICD-10-CM | POA: Diagnosis not present

## 2013-01-05 DIAGNOSIS — R279 Unspecified lack of coordination: Secondary | ICD-10-CM | POA: Diagnosis not present

## 2013-01-05 DIAGNOSIS — M2550 Pain in unspecified joint: Secondary | ICD-10-CM | POA: Diagnosis not present

## 2013-01-05 DIAGNOSIS — M159 Polyosteoarthritis, unspecified: Secondary | ICD-10-CM | POA: Diagnosis not present

## 2013-01-05 DIAGNOSIS — R262 Difficulty in walking, not elsewhere classified: Secondary | ICD-10-CM | POA: Diagnosis not present

## 2013-01-05 DIAGNOSIS — Z9181 History of falling: Secondary | ICD-10-CM | POA: Diagnosis not present

## 2013-01-10 DIAGNOSIS — M353 Polymyalgia rheumatica: Secondary | ICD-10-CM | POA: Diagnosis not present

## 2013-01-10 DIAGNOSIS — IMO0001 Reserved for inherently not codable concepts without codable children: Secondary | ICD-10-CM | POA: Diagnosis not present

## 2013-01-11 DIAGNOSIS — Z9181 History of falling: Secondary | ICD-10-CM | POA: Diagnosis not present

## 2013-01-11 DIAGNOSIS — R262 Difficulty in walking, not elsewhere classified: Secondary | ICD-10-CM | POA: Diagnosis not present

## 2013-01-11 DIAGNOSIS — R279 Unspecified lack of coordination: Secondary | ICD-10-CM | POA: Diagnosis not present

## 2013-01-11 DIAGNOSIS — M6281 Muscle weakness (generalized): Secondary | ICD-10-CM | POA: Diagnosis not present

## 2013-01-11 DIAGNOSIS — M2550 Pain in unspecified joint: Secondary | ICD-10-CM | POA: Diagnosis not present

## 2013-01-11 DIAGNOSIS — M159 Polyosteoarthritis, unspecified: Secondary | ICD-10-CM | POA: Diagnosis not present

## 2013-01-12 DIAGNOSIS — Z9181 History of falling: Secondary | ICD-10-CM | POA: Diagnosis not present

## 2013-01-12 DIAGNOSIS — M2550 Pain in unspecified joint: Secondary | ICD-10-CM | POA: Diagnosis not present

## 2013-01-12 DIAGNOSIS — M6281 Muscle weakness (generalized): Secondary | ICD-10-CM | POA: Diagnosis not present

## 2013-01-12 DIAGNOSIS — M159 Polyosteoarthritis, unspecified: Secondary | ICD-10-CM | POA: Diagnosis not present

## 2013-01-12 DIAGNOSIS — R279 Unspecified lack of coordination: Secondary | ICD-10-CM | POA: Diagnosis not present

## 2013-01-12 DIAGNOSIS — R262 Difficulty in walking, not elsewhere classified: Secondary | ICD-10-CM | POA: Diagnosis not present

## 2013-01-13 DIAGNOSIS — M6281 Muscle weakness (generalized): Secondary | ICD-10-CM | POA: Diagnosis not present

## 2013-01-13 DIAGNOSIS — Z9181 History of falling: Secondary | ICD-10-CM | POA: Diagnosis not present

## 2013-01-13 DIAGNOSIS — M159 Polyosteoarthritis, unspecified: Secondary | ICD-10-CM | POA: Diagnosis not present

## 2013-01-13 DIAGNOSIS — R262 Difficulty in walking, not elsewhere classified: Secondary | ICD-10-CM | POA: Diagnosis not present

## 2013-01-13 DIAGNOSIS — R279 Unspecified lack of coordination: Secondary | ICD-10-CM | POA: Diagnosis not present

## 2013-01-13 DIAGNOSIS — M2550 Pain in unspecified joint: Secondary | ICD-10-CM | POA: Diagnosis not present

## 2013-01-17 DIAGNOSIS — M353 Polymyalgia rheumatica: Secondary | ICD-10-CM | POA: Diagnosis not present

## 2013-01-17 DIAGNOSIS — Z79899 Other long term (current) drug therapy: Secondary | ICD-10-CM | POA: Diagnosis not present

## 2013-01-19 DIAGNOSIS — Z9181 History of falling: Secondary | ICD-10-CM | POA: Diagnosis not present

## 2013-01-19 DIAGNOSIS — R262 Difficulty in walking, not elsewhere classified: Secondary | ICD-10-CM | POA: Diagnosis not present

## 2013-01-19 DIAGNOSIS — M2550 Pain in unspecified joint: Secondary | ICD-10-CM | POA: Diagnosis not present

## 2013-01-19 DIAGNOSIS — R279 Unspecified lack of coordination: Secondary | ICD-10-CM | POA: Diagnosis not present

## 2013-01-19 DIAGNOSIS — M6281 Muscle weakness (generalized): Secondary | ICD-10-CM | POA: Diagnosis not present

## 2013-01-19 DIAGNOSIS — R7309 Other abnormal glucose: Secondary | ICD-10-CM | POA: Diagnosis not present

## 2013-01-19 DIAGNOSIS — M159 Polyosteoarthritis, unspecified: Secondary | ICD-10-CM | POA: Diagnosis not present

## 2013-03-10 DIAGNOSIS — J42 Unspecified chronic bronchitis: Secondary | ICD-10-CM | POA: Diagnosis not present

## 2013-03-10 DIAGNOSIS — R488 Other symbolic dysfunctions: Secondary | ICD-10-CM | POA: Diagnosis not present

## 2013-03-10 DIAGNOSIS — M6281 Muscle weakness (generalized): Secondary | ICD-10-CM | POA: Diagnosis not present

## 2013-03-16 DIAGNOSIS — R488 Other symbolic dysfunctions: Secondary | ICD-10-CM | POA: Diagnosis not present

## 2013-03-16 DIAGNOSIS — J42 Unspecified chronic bronchitis: Secondary | ICD-10-CM | POA: Diagnosis not present

## 2013-03-16 DIAGNOSIS — M6281 Muscle weakness (generalized): Secondary | ICD-10-CM | POA: Diagnosis not present

## 2013-03-21 DIAGNOSIS — J42 Unspecified chronic bronchitis: Secondary | ICD-10-CM | POA: Diagnosis not present

## 2013-03-21 DIAGNOSIS — R488 Other symbolic dysfunctions: Secondary | ICD-10-CM | POA: Diagnosis not present

## 2013-03-21 DIAGNOSIS — M6281 Muscle weakness (generalized): Secondary | ICD-10-CM | POA: Diagnosis not present

## 2013-03-22 DIAGNOSIS — R488 Other symbolic dysfunctions: Secondary | ICD-10-CM | POA: Diagnosis not present

## 2013-03-22 DIAGNOSIS — R413 Other amnesia: Secondary | ICD-10-CM | POA: Diagnosis not present

## 2013-03-22 DIAGNOSIS — E7800 Pure hypercholesterolemia, unspecified: Secondary | ICD-10-CM | POA: Diagnosis not present

## 2013-03-22 DIAGNOSIS — M109 Gout, unspecified: Secondary | ICD-10-CM | POA: Diagnosis not present

## 2013-03-22 DIAGNOSIS — IMO0002 Reserved for concepts with insufficient information to code with codable children: Secondary | ICD-10-CM | POA: Diagnosis not present

## 2013-03-22 DIAGNOSIS — M6281 Muscle weakness (generalized): Secondary | ICD-10-CM | POA: Diagnosis not present

## 2013-03-22 DIAGNOSIS — J42 Unspecified chronic bronchitis: Secondary | ICD-10-CM | POA: Diagnosis not present

## 2013-03-29 DIAGNOSIS — M6281 Muscle weakness (generalized): Secondary | ICD-10-CM | POA: Diagnosis not present

## 2013-03-29 DIAGNOSIS — R488 Other symbolic dysfunctions: Secondary | ICD-10-CM | POA: Diagnosis not present

## 2013-03-29 DIAGNOSIS — J42 Unspecified chronic bronchitis: Secondary | ICD-10-CM | POA: Diagnosis not present

## 2013-03-30 DIAGNOSIS — R488 Other symbolic dysfunctions: Secondary | ICD-10-CM | POA: Diagnosis not present

## 2013-03-30 DIAGNOSIS — J42 Unspecified chronic bronchitis: Secondary | ICD-10-CM | POA: Diagnosis not present

## 2013-03-30 DIAGNOSIS — M6281 Muscle weakness (generalized): Secondary | ICD-10-CM | POA: Diagnosis not present

## 2013-03-31 DIAGNOSIS — M6281 Muscle weakness (generalized): Secondary | ICD-10-CM | POA: Diagnosis not present

## 2013-03-31 DIAGNOSIS — R488 Other symbolic dysfunctions: Secondary | ICD-10-CM | POA: Diagnosis not present

## 2013-03-31 DIAGNOSIS — J42 Unspecified chronic bronchitis: Secondary | ICD-10-CM | POA: Diagnosis not present

## 2013-04-04 DIAGNOSIS — J42 Unspecified chronic bronchitis: Secondary | ICD-10-CM | POA: Diagnosis not present

## 2013-04-04 DIAGNOSIS — R488 Other symbolic dysfunctions: Secondary | ICD-10-CM | POA: Diagnosis not present

## 2013-04-04 DIAGNOSIS — M6281 Muscle weakness (generalized): Secondary | ICD-10-CM | POA: Diagnosis not present

## 2013-04-05 DIAGNOSIS — J42 Unspecified chronic bronchitis: Secondary | ICD-10-CM | POA: Diagnosis not present

## 2013-04-05 DIAGNOSIS — R488 Other symbolic dysfunctions: Secondary | ICD-10-CM | POA: Diagnosis not present

## 2013-04-05 DIAGNOSIS — M6281 Muscle weakness (generalized): Secondary | ICD-10-CM | POA: Diagnosis not present

## 2013-04-06 DIAGNOSIS — R488 Other symbolic dysfunctions: Secondary | ICD-10-CM | POA: Diagnosis not present

## 2013-04-06 DIAGNOSIS — J42 Unspecified chronic bronchitis: Secondary | ICD-10-CM | POA: Diagnosis not present

## 2013-04-06 DIAGNOSIS — M6281 Muscle weakness (generalized): Secondary | ICD-10-CM | POA: Diagnosis not present

## 2013-04-07 DIAGNOSIS — R488 Other symbolic dysfunctions: Secondary | ICD-10-CM | POA: Diagnosis not present

## 2013-04-07 DIAGNOSIS — M6281 Muscle weakness (generalized): Secondary | ICD-10-CM | POA: Diagnosis not present

## 2013-04-07 DIAGNOSIS — J42 Unspecified chronic bronchitis: Secondary | ICD-10-CM | POA: Diagnosis not present

## 2013-04-12 DIAGNOSIS — Z961 Presence of intraocular lens: Secondary | ICD-10-CM | POA: Diagnosis not present

## 2013-04-18 DIAGNOSIS — M353 Polymyalgia rheumatica: Secondary | ICD-10-CM | POA: Diagnosis not present

## 2013-04-18 DIAGNOSIS — R5383 Other fatigue: Secondary | ICD-10-CM | POA: Diagnosis not present

## 2013-04-18 DIAGNOSIS — N184 Chronic kidney disease, stage 4 (severe): Secondary | ICD-10-CM | POA: Diagnosis not present

## 2013-04-18 DIAGNOSIS — R413 Other amnesia: Secondary | ICD-10-CM | POA: Diagnosis not present

## 2013-04-18 DIAGNOSIS — R5381 Other malaise: Secondary | ICD-10-CM | POA: Diagnosis not present

## 2013-04-18 DIAGNOSIS — Z79899 Other long term (current) drug therapy: Secondary | ICD-10-CM | POA: Diagnosis not present

## 2013-05-14 ENCOUNTER — Emergency Department (HOSPITAL_COMMUNITY)
Admission: EM | Admit: 2013-05-14 | Discharge: 2013-05-14 | Disposition: A | Discharge status: Home / Self Care | Payer: Medicare Other | Attending: Emergency Medicine | Admitting: Emergency Medicine

## 2013-05-14 ENCOUNTER — Encounter (HOSPITAL_COMMUNITY): Payer: Self-pay | Admitting: Emergency Medicine

## 2013-05-14 ENCOUNTER — Emergency Department (HOSPITAL_COMMUNITY): Payer: Medicare Other

## 2013-05-14 DIAGNOSIS — J45909 Unspecified asthma, uncomplicated: Secondary | ICD-10-CM | POA: Insufficient documentation

## 2013-05-14 DIAGNOSIS — IMO0002 Reserved for concepts with insufficient information to code with codable children: Secondary | ICD-10-CM | POA: Diagnosis not present

## 2013-05-14 DIAGNOSIS — N390 Urinary tract infection, site not specified: Secondary | ICD-10-CM

## 2013-05-14 DIAGNOSIS — E860 Dehydration: Secondary | ICD-10-CM | POA: Diagnosis not present

## 2013-05-14 DIAGNOSIS — R197 Diarrhea, unspecified: Secondary | ICD-10-CM

## 2013-05-14 DIAGNOSIS — R112 Nausea with vomiting, unspecified: Secondary | ICD-10-CM | POA: Diagnosis not present

## 2013-05-14 DIAGNOSIS — R059 Cough, unspecified: Secondary | ICD-10-CM | POA: Diagnosis not present

## 2013-05-14 DIAGNOSIS — R0602 Shortness of breath: Secondary | ICD-10-CM | POA: Insufficient documentation

## 2013-05-14 DIAGNOSIS — R05 Cough: Secondary | ICD-10-CM | POA: Diagnosis not present

## 2013-05-14 DIAGNOSIS — Z79899 Other long term (current) drug therapy: Secondary | ICD-10-CM | POA: Insufficient documentation

## 2013-05-14 DIAGNOSIS — R6889 Other general symptoms and signs: Secondary | ICD-10-CM | POA: Diagnosis not present

## 2013-05-14 DIAGNOSIS — R231 Pallor: Secondary | ICD-10-CM | POA: Diagnosis not present

## 2013-05-14 DIAGNOSIS — I7090 Unspecified atherosclerosis: Secondary | ICD-10-CM | POA: Diagnosis not present

## 2013-05-14 HISTORY — DX: Unspecified asthma, uncomplicated: J45.909

## 2013-05-14 LAB — URINALYSIS, ROUTINE W REFLEX MICROSCOPIC
BILIRUBIN URINE: NEGATIVE
GLUCOSE, UA: NEGATIVE mg/dL
KETONES UR: NEGATIVE mg/dL
Leukocytes, UA: NEGATIVE
Nitrite: POSITIVE — AB
PH: 5 (ref 5.0–8.0)
Specific Gravity, Urine: 1.022 (ref 1.005–1.030)
Urobilinogen, UA: 0.2 mg/dL (ref 0.0–1.0)

## 2013-05-14 LAB — COMPREHENSIVE METABOLIC PANEL
ALBUMIN: 3.8 g/dL (ref 3.5–5.2)
ALK PHOS: 55 U/L (ref 39–117)
ALT: 32 U/L (ref 0–35)
AST: 35 U/L (ref 0–37)
BILIRUBIN TOTAL: 0.4 mg/dL (ref 0.3–1.2)
BUN: 35 mg/dL — ABNORMAL HIGH (ref 6–23)
CHLORIDE: 99 meq/L (ref 96–112)
CO2: 22 meq/L (ref 19–32)
CREATININE: 1.29 mg/dL — AB (ref 0.50–1.10)
Calcium: 9.9 mg/dL (ref 8.4–10.5)
GFR calc Af Amer: 42 mL/min — ABNORMAL LOW (ref >90)
GFR, EST NON AFRICAN AMERICAN: 36 mL/min — AB (ref >90)
Glucose, Bld: 144 mg/dL — ABNORMAL HIGH (ref 70–99)
POTASSIUM: 3.9 meq/L (ref 3.7–5.3)
Sodium: 141 mEq/L (ref 137–147)
Total Protein: 7 g/dL (ref 6.0–8.3)

## 2013-05-14 LAB — CBC WITH DIFFERENTIAL/PLATELET
BASOS PCT: 0 % (ref 0–1)
Basophils Absolute: 0 10*3/uL (ref 0.0–0.1)
Eosinophils Absolute: 0.2 10*3/uL (ref 0.0–0.7)
Eosinophils Relative: 1 % (ref 0–5)
HEMATOCRIT: 45.6 % (ref 36.0–46.0)
Hemoglobin: 15.4 g/dL — ABNORMAL HIGH (ref 12.0–15.0)
LYMPHS PCT: 7 % — AB (ref 12–46)
Lymphs Abs: 1 10*3/uL (ref 0.7–4.0)
MCH: 30.7 pg (ref 26.0–34.0)
MCHC: 33.8 g/dL (ref 30.0–36.0)
MCV: 91 fL (ref 78.0–100.0)
MONO ABS: 1 10*3/uL (ref 0.1–1.0)
Monocytes Relative: 7 % (ref 3–12)
NEUTROS ABS: 13 10*3/uL — AB (ref 1.7–7.7)
NEUTROS PCT: 86 % — AB (ref 43–77)
Platelets: 181 10*3/uL (ref 150–400)
RBC: 5.01 MIL/uL (ref 3.87–5.11)
RDW: 15 % (ref 11.5–15.5)
WBC: 15.2 10*3/uL — AB (ref 4.0–10.5)

## 2013-05-14 LAB — TROPONIN I: Troponin I: <0.3 ng/mL (ref <0.30)

## 2013-05-14 LAB — CLOSTRIDIUM DIFFICILE BY PCR: Toxigenic C. Difficile by PCR: NEGATIVE

## 2013-05-14 LAB — LIPASE, BLOOD: LIPASE: 54 U/L (ref 11–59)

## 2013-05-14 LAB — URINE MICROSCOPIC-ADD ON

## 2013-05-14 MED ORDER — ONDANSETRON HCL 4 MG/2ML IJ SOLN
4.0000 mg | Freq: Once | INTRAMUSCULAR | Status: AC
  Administered 2013-05-14: 4 mg via INTRAVENOUS
  Filled 2013-05-14: qty 2

## 2013-05-14 MED ORDER — SODIUM CHLORIDE 0.9 % IV BOLUS (SEPSIS)
700.0000 mL | Freq: Once | INTRAVENOUS | Status: AC
  Administered 2013-05-14: 700 mL via INTRAVENOUS

## 2013-05-14 MED ORDER — SODIUM CHLORIDE 0.9 % IV SOLN
INTRAVENOUS | Status: DC
  Administered 2013-05-14: 09:00:00 via INTRAVENOUS

## 2013-05-14 MED ORDER — ONDANSETRON HCL 4 MG PO TABS: 4.0000 mg | ORAL_TABLET | Freq: Three times a day (TID) | ORAL | Status: DC | PRN

## 2013-05-14 MED ORDER — CEFTRIAXONE SODIUM 1 G IJ SOLR
1.0000 g | Freq: Once | INTRAMUSCULAR | Status: AC
  Administered 2013-05-14: 1 g via INTRAVENOUS
  Filled 2013-05-14: qty 10

## 2013-05-14 MED ORDER — SODIUM CHLORIDE 0.9 % IV BOLUS (SEPSIS)
500.0000 mL | Freq: Once | INTRAVENOUS | Status: AC
  Administered 2013-05-14: 500 mL via INTRAVENOUS

## 2013-05-14 MED ORDER — CEPHALEXIN 500 MG PO CAPS: 500.0000 mg | ORAL_CAPSULE | Freq: Three times a day (TID) | ORAL | Status: DC

## 2013-05-14 MED ORDER — ONDANSETRON HCL 4 MG/2ML IJ SOLN
4.0000 mg | Freq: Once | INTRAMUSCULAR | Status: DC
  Filled 2013-05-14: qty 2

## 2013-05-14 NOTE — ED Notes (Signed)
Michele Maxwell [phone] Contact if needed.

## 2013-05-14 NOTE — ED Notes (Signed)
Patient states that she does not have to use the restroom at this time, acknowledges that we need samples. Patient states that she is tired and will let me know when she has to go.

## 2013-05-14 NOTE — ED Notes (Signed)
Per EMS , [facility]  With complaint of nausea , vomiting and diarrhea which started around 3 am this morning, pt. Denies pain , no SOB. Received 4mg  of IV Zofran per EMS .

## 2013-05-14 NOTE — ED Notes (Signed)
Pt given coke 

## 2013-05-14 NOTE — ED Notes (Signed)
Patient was asking for water, gave patient 1/2 cup water per Dr instructions.

## 2013-05-14 NOTE — ED Notes (Signed)
Medical Record Release was filled out and given to [place] to fax.

## 2013-05-14 NOTE — ED Notes (Addendum)
Pt notified that we need a urine and stool sample.    Pt's son reports that Pt has had intermittent diarrhea for several months and one episode of bright red blood in stool last week.     Pt can not hear, but reads lips.

## 2013-05-14 NOTE — ED Notes (Signed)
Bed: [id number] Expected date: 05/14/13 Expected time: 6:02 AM Means of arrival: Ambulance Comments: N, v

## 2013-05-14 NOTE — ED Provider Notes (Signed)
CSN: [id number]     Arrival date & time 05/14/13  4098 History   First MD Initiated Contact with Patient 05/14/13 0701     Chief Complaint  Patient presents with  . Diarrhea  . Nausea  . Emesis   Level 5 Caveat for age  (Consider location/radiation/quality/duration/timing/severity/associated sxs/prior Treatment) HPI Patient presents to the emergency department via EMS. She states she started getting sick about midnight. She indicates she's had multiple episodes of vomiting diarrhea, possibly 10. She denies seeing any blood. She denies any abdominal pain. She states she feels weak. She describes diarrhea as watery. She denies being on recent antibiotics. Her son states there has been several episodes of similar in the nursing home.  Patient denies chest pain, however she has had some shortness of breath for the last 2-3 days per her son. She has a chronic cough. She has been on prednisone chronically and they're currently trying to wean her off. He thinks her current dose is 10 mg a day. They deny any prior history of heart problems. She is unaware of any prior abnormal EKGs  He reports she is started having some memory problems and the nursing staff to give her her medications. He also states she gets lost.  Patient's son  states she has gout however they stopped her allopurinol   PCP Dr Pete Glatter  Past Medical History  Diagnosis Date  . Asthma    History reviewed. No pertinent past surgical history. History reviewed. No pertinent family history. History  Substance Use Topics  . Smoking status: Never Smoker   . Smokeless tobacco: Not on file  . Alcohol Use: No   Lives in Independent Living   OB History   Grav Para Term Preterm Abortions TAB SAB Ect Mult Living                 Review of Systems  All other systems reviewed and are negative.      Allergies  Review of patient's allergies indicates no known allergies.  Home Medications   Current Outpatient Rx  Name   Route  Sig  Dispense  Refill  . allopurinol (ZYLOPRIM) 300 MG tablet   Oral   Take 300 mg by mouth daily.         . budesonide (PULMICORT) 180 MCG/ACT inhaler   Inhalation   Inhale 2 puffs into the lungs daily.         Marland Kitchen lovastatin (MEVACOR) 40 MG tablet   Oral   Take 40 mg by mouth at bedtime.         . predniSONE (DELTASONE) 10 MG tablet   Oral   Take 10 mg by mouth daily with breakfast. For 30 days, then start 5mg           BP 144/72  Pulse 109  Temp(Src) 97.4 F (36.3 C) (Oral)  Resp 20  SpO2 93%  Vital signs normal except tachycardia  Physical Exam  Nursing note and vitals reviewed. Constitutional: She is oriented to person, place, and time. She appears well-developed and well-nourished.  Non-toxic appearance. She does not appear ill. No distress.  HENT:  Head: Normocephalic and atraumatic.  Right Ear: External ear normal.  Left Ear: External ear normal.  Nose: Nose normal. No mucosal edema or rhinorrhea.  Mouth/Throat: Mucous membranes are normal. No dental abscesses or uvula swelling.  Tongue dry  Eyes: Conjunctivae and EOM are normal. Pupils are equal, round, and reactive to light.  Neck: Normal range of motion and full  passive range of motion without pain. Neck supple.  Cardiovascular: Normal rate, regular rhythm and normal heart sounds.  Exam reveals no gallop and no friction rub.   No murmur heard. Pulmonary/Chest: Effort normal and breath sounds normal. No respiratory distress. She has no wheezes. She has no rhonchi. She has no rales. She exhibits no tenderness and no crepitus.  Abdominal: Soft. Normal appearance and bowel sounds are normal. She exhibits no distension. There is no tenderness. There is no rebound and no guarding.  Musculoskeletal: Normal range of motion. She exhibits no edema and no tenderness.  Moves all extremities well.   Neurological: She is alert and oriented to person, place, and time. She has normal strength. No cranial nerve  deficit.  Skin: Skin is warm, dry and intact. No rash noted. No erythema. There is pallor.  Psychiatric: She has a normal mood and affect. Her speech is normal and behavior is normal. Her mood appears not anxious.    ED Course  Procedures (including critical care time)  Medications  0.9 %  sodium chloride infusion ( Intravenous Stopped 05/14/13 1527)  ondansetron (ZOFRAN) injection 4 mg (not administered)  ondansetron (ZOFRAN) injection 4 mg (4 mg Intravenous Given 05/14/13 0719)  sodium chloride 0.9 % bolus 700 mL (0 mLs Intravenous Stopped 05/14/13 1110)  sodium chloride 0.9 % bolus 500 mL (0 mLs Intravenous Stopped 05/14/13 1459)  cefTRIAXone (ROCEPHIN) 1 g in dextrose 5 % 50 mL IVPB (0 g Intravenous Stopped 05/14/13 1527)   Records obtained from [facility] in [place], [place]. Pt has 2 EKG's from March 2014 showing LBBB. She has lab work showing BUN/Creat were 27/1.29, Hb 12.1  in August 2014  Patient was given IV fluids. She is started drinking fluids on her own.  Son here 12:00 given test results and that her EKG and renal function is stable from results from [facility]. Pt has been drinking. UA just obtained, hopefully she will be able to be discharged.   14:15 pt has been drinking fluids without vomiting, wants to eat.   Patient given IV antibiotics for her UTI. She did have return of her nausea however it had been several hours and she had nausea medication. Patient is smiling and looks much improved from when she arrived to the ED. Family is concerned that this is her fourth episode of diarrhea in several months. Stool cultures were sent to the lab and can be checked by her PCP.  Labs Review Results for orders placed during the hospital encounter of 05/14/13  LIPASE, BLOOD      Result Value Ref Range   Lipase 54  11 - 59 U/L  CBC WITH DIFFERENTIAL      Result Value Ref Range   WBC 15.2 (*) 4.0 - 10.5 K/uL   RBC 5.01  3.87 - 5.11 MIL/uL   Hemoglobin 15.4 (*)  12.0 - 15.0 g/dL   HCT 16.1  09.6 - 04.5 %   MCV 91.0  78.0 - 100.0 fL   MCH 30.7  26.0 - 34.0 pg   MCHC 33.8  30.0 - 36.0 g/dL   RDW 40.9  81.1 - 91.4 %   Platelets 181  150 - 400 K/uL   Neutrophils Relative % 86 (*) 43 - 77 %   Neutro Abs 13.0 (*) 1.7 - 7.7 K/uL   Lymphocytes Relative 7 (*) 12 - 46 %   Lymphs Abs 1.0  0.7 - 4.0 K/uL   Monocytes Relative 7  3 - 12 %  Monocytes Absolute 1.0  0.1 - 1.0 K/uL   Eosinophils Relative 1  0 - 5 %   Eosinophils Absolute 0.2  0.0 - 0.7 K/uL   Basophils Relative 0  0 - 1 %   Basophils Absolute 0.0  0.0 - 0.1 K/uL  COMPREHENSIVE METABOLIC PANEL      Result Value Ref Range   Sodium 141  137 - 147 mEq/L   Potassium 3.9  3.7 - 5.3 mEq/L   Chloride 99  96 - 112 mEq/L   CO2 22  19 - 32 mEq/L   Glucose, Bld 144 (*) 70 - 99 mg/dL   BUN 35 (*) 6 - 23 mg/dL   Creatinine, Ser 1.611.29 (*) 0.50 - 1.10 mg/dL   Calcium 9.9  8.4 - 09.610.5 mg/dL   Total Protein 7.0  6.0 - 8.3 g/dL   Albumin 3.8  3.5 - 5.2 g/dL   AST 35  0 - 37 U/L   ALT 32  0 - 35 U/L   Alkaline Phosphatase 55  39 - 117 U/L   Total Bilirubin 0.4  0.3 - 1.2 mg/dL   GFR calc non Af Amer 36 (*) >90 mL/min   GFR calc Af Amer 42 (*) >90 mL/min  TROPONIN I      Result Value Ref Range   Troponin I <0.30  <0.30 ng/mL  URINALYSIS, ROUTINE W REFLEX MICROSCOPIC      Result Value Ref Range   Color, Urine YELLOW  YELLOW   APPearance CLEAR  CLEAR   Specific Gravity, Urine 1.022  1.005 - 1.030   pH 5.0  5.0 - 8.0   Glucose, UA NEGATIVE  NEGATIVE mg/dL   Hgb urine dipstick SMALL (*) NEGATIVE   Bilirubin Urine NEGATIVE  NEGATIVE   Ketones, ur NEGATIVE  NEGATIVE mg/dL   Protein, ur >045>300 (*) NEGATIVE mg/dL   Urobilinogen, UA 0.2  0.0 - 1.0 mg/dL   Nitrite POSITIVE (*) NEGATIVE   Leukocytes, UA NEGATIVE  NEGATIVE  URINE MICROSCOPIC-ADD ON      Result Value Ref Range   WBC, UA 0-2  <3 WBC/hpf   Urine-Other MANY YEAST      Laboratory interpretation all normal except renal insuffic,  leukocytosis    Imaging Review Dg Chest Portable 1 View  05/14/2013   CLINICAL DATA:  Cough, shortness of breath, weakness  EXAM: PORTABLE CHEST - 1 VIEW  COMPARISON:  None.  FINDINGS: The heart size and mediastinal contours are within normal limits. Both lungs are clear. The visualized skeletal structures are unremarkable. Lungs are hypoaerated with crowding of the bronchovascular markings. The aorta is unfolded and ectatic. This likely accounts for prominence of the right peritracheal stripe along with rotation to the right. Right glenohumeral joint degenerative change noted.  IMPRESSION: Cardiomegaly without focal acute finding. Lungs are hypoaerated with crowding of the bronchovascular markings. If symptoms persist, consider PA and lateral chest radiographs obtained at full inspiration when the patient is clinically able.   Electronically Signed   By: Christiana PellantGretchen  Green M.D.   On: 05/14/2013 07:43     EKG Interpretation   Date/Time:  Sunday May 14 2013 06:55:24 EDT Ventricular Rate:  112 PR Interval:  150 QRS Duration: 127 QT Interval:  363 QTC Calculation: 495 R Axis:   0 Text Interpretation:  Sinus tachycardia Left bundle branch block Inferior  infarct, acute (RCA) Probable RV involvement, suggest recording right  precordial leads No old tracing to compare Confirmed by Desirai Traxler  MD-I, Linnae Rasool  ([id number]) on  05/14/2013 8:01:55 AM      MDM   Final diagnoses:  Nausea vomiting and diarrhea  Dehydration  UTI (urinary tract infection)    New Prescriptions   CEPHALEXIN (KEFLEX) 500 MG CAPSULE    Take 1 capsule (500 mg total) by mouth 3 (three) times daily.   ONDANSETRON (ZOFRAN) 4 MG TABLET    Take 1 tablet (4 mg total) by mouth every 8 (eight) hours as needed for nausea or vomiting.   Plan discharge  Devoria Albe, MD, Franz Dell, MD 05/14/13 [phone]

## 2013-05-14 NOTE — Discharge Instructions (Signed)
Give her plenty of fluids to drink. Avoid milk until the diarrhea is gone. Use the zofran for nausea or vomiting. Give her the antibiotics until gone. She can have imodium if needed for diarrhea. Recheck if she seems worse again.

## 2013-05-15 ENCOUNTER — Emergency Department (HOSPITAL_COMMUNITY)
Admission: EM | Admit: 2013-05-15 | Discharge: 2013-05-15 | Disposition: A | Discharge status: Home / Self Care | Payer: Medicare Other | Attending: Emergency Medicine | Admitting: Emergency Medicine

## 2013-05-15 ENCOUNTER — Encounter (HOSPITAL_COMMUNITY): Payer: Self-pay | Admitting: Emergency Medicine

## 2013-05-15 ENCOUNTER — Emergency Department (HOSPITAL_COMMUNITY): Payer: Medicare Other

## 2013-05-15 DIAGNOSIS — R0602 Shortness of breath: Secondary | ICD-10-CM

## 2013-05-15 DIAGNOSIS — I7090 Unspecified atherosclerosis: Secondary | ICD-10-CM | POA: Diagnosis not present

## 2013-05-15 DIAGNOSIS — Z79899 Other long term (current) drug therapy: Secondary | ICD-10-CM | POA: Diagnosis not present

## 2013-05-15 DIAGNOSIS — IMO0002 Reserved for concepts with insufficient information to code with codable children: Secondary | ICD-10-CM | POA: Insufficient documentation

## 2013-05-15 DIAGNOSIS — R197 Diarrhea, unspecified: Secondary | ICD-10-CM | POA: Diagnosis not present

## 2013-05-15 DIAGNOSIS — Z792 Long term (current) use of antibiotics: Secondary | ICD-10-CM | POA: Diagnosis not present

## 2013-05-15 DIAGNOSIS — R0789 Other chest pain: Secondary | ICD-10-CM | POA: Diagnosis not present

## 2013-05-15 DIAGNOSIS — J45901 Unspecified asthma with (acute) exacerbation: Secondary | ICD-10-CM | POA: Insufficient documentation

## 2013-05-15 LAB — CBC WITH DIFFERENTIAL/PLATELET
BASOS ABS: 0 10*3/uL (ref 0.0–0.1)
Basophils Relative: 0 % (ref 0–1)
Eosinophils Absolute: 0.2 10*3/uL (ref 0.0–0.7)
Eosinophils Relative: 2 % (ref 0–5)
HCT: 37.8 % (ref 36.0–46.0)
HEMOGLOBIN: 12.8 g/dL (ref 12.0–15.0)
LYMPHS PCT: 12 % (ref 12–46)
Lymphs Abs: 1.3 10*3/uL (ref 0.7–4.0)
MCH: 30.4 pg (ref 26.0–34.0)
MCHC: 33.9 g/dL (ref 30.0–36.0)
MCV: 89.8 fL (ref 78.0–100.0)
Monocytes Absolute: 0.9 10*3/uL (ref 0.1–1.0)
Monocytes Relative: 8 % (ref 3–12)
NEUTROS ABS: 9.1 10*3/uL — AB (ref 1.7–7.7)
Neutrophils Relative %: 79 % — ABNORMAL HIGH (ref 43–77)
Platelets: 150 10*3/uL (ref 150–400)
RBC: 4.21 MIL/uL (ref 3.87–5.11)
RDW: 15.1 % (ref 11.5–15.5)
WBC: 11.5 10*3/uL — ABNORMAL HIGH (ref 4.0–10.5)

## 2013-05-15 LAB — COMPREHENSIVE METABOLIC PANEL
ALT: 25 U/L (ref 0–35)
AST: 31 U/L (ref 0–37)
Albumin: 3 g/dL — ABNORMAL LOW (ref 3.5–5.2)
Alkaline Phosphatase: 40 U/L (ref 39–117)
BILIRUBIN TOTAL: 0.4 mg/dL (ref 0.3–1.2)
BUN: 26 mg/dL — ABNORMAL HIGH (ref 6–23)
CHLORIDE: 101 meq/L (ref 96–112)
CO2: 23 meq/L (ref 19–32)
CREATININE: 1.26 mg/dL — AB (ref 0.50–1.10)
Calcium: 8.6 mg/dL (ref 8.4–10.5)
GFR calc Af Amer: 43 mL/min — ABNORMAL LOW (ref >90)
GFR, EST NON AFRICAN AMERICAN: 37 mL/min — AB (ref >90)
GLUCOSE: 103 mg/dL — AB (ref 70–99)
Potassium: 3.5 mEq/L — ABNORMAL LOW (ref 3.7–5.3)
Sodium: 139 mEq/L (ref 137–147)
Total Protein: 5.9 g/dL — ABNORMAL LOW (ref 6.0–8.3)

## 2013-05-15 LAB — D-DIMER, QUANTITATIVE (NOT AT ARMC): D DIMER QUANT: 1.13 ug{FEU}/mL — AB (ref 0.00–0.48)

## 2013-05-15 LAB — TROPONIN I: Troponin I: <0.3 ng/mL (ref <0.30)

## 2013-05-15 MED ORDER — SODIUM CHLORIDE 0.9 % IV BOLUS (SEPSIS)
1000.0000 mL | Freq: Once | INTRAVENOUS | Status: AC
  Administered 2013-05-15: 1000 mL via INTRAVENOUS

## 2013-05-15 MED ORDER — LOPERAMIDE HCL 2 MG PO CAPS
4.0000 mg | ORAL_CAPSULE | Freq: Once | ORAL | Status: AC
  Administered 2013-05-15: 4 mg via ORAL
  Filled 2013-05-15: qty 2

## 2013-05-15 MED ORDER — LOPERAMIDE HCL 2 MG PO CAPS: 4.0000 mg | ORAL_CAPSULE | ORAL | Status: DC | PRN

## 2013-05-15 MED ORDER — IOHEXOL 350 MG/ML SOLN
80.0000 mL | Freq: Once | INTRAVENOUS | Status: AC | PRN
  Administered 2013-05-15: 80 mL via INTRAVENOUS

## 2013-05-15 NOTE — ED Notes (Signed)
She is with her son. She c/o having a large, watery diarrhea stool this morning.  She states she was here yesterday, at which time she was having n/v/d; but today she denies nausea and vomiting, and she also denies any pain or discomfort.  Her abd. Is soft and non-tender to palpation.

## 2013-05-15 NOTE — Discharge Instructions (Signed)
Shortness of Breath Referred to cardiology.  Call for follow-up.  Shortness of breath means you have trouble breathing. Shortness of breath may indicate that you have a medical problem. You should seek immediate medical care for shortness of breath. CAUSES   Not enough oxygen in the air (as with high altitudes or a smoke-filled room).  Short-term (acute) lung disease, including:  Infections, such as pneumonia.  Fluid in the lungs, such as heart failure.  A blood clot in the lungs (pulmonary embolism).  Long-term (chronic) lung diseases.  Heart disease (heart attack, angina, heart failure, and others).  Low red blood cells (anemia).  Poor physical fitness. This can cause shortness of breath when you exercise.  Chest or back injuries or stiffness.  Being overweight.  Smoking.  Anxiety. This can make you feel like you are not getting enough air. DIAGNOSIS  Serious medical problems can usually be found during your physical exam. Tests may also be done to determine why you are having shortness of breath. Tests may include:  Chest X-rays.  Lung function tests.  Blood tests.  Electrocardiography.  Exercise testing.  Echocardiography.  Imaging scans. Your caregiver may not be able to find a cause for your shortness of breath after your exam. In this case, it is important to have a follow-up exam with your caregiver as directed.  TREATMENT  Treatment for shortness of breath depends on the cause of your symptoms and can vary greatly. HOME CARE INSTRUCTIONS   Do not smoke. Smoking is a common cause of shortness of breath. If you smoke, ask for help to quit.  Avoid being around chemicals or things that may bother your breathing, such as paint fumes and dust.  Rest as needed. Slowly resume your usual activities.  If medicines were prescribed, take them as directed for the full length of time directed. This includes oxygen and any inhaled medicines.  Keep all follow-up  appointments as directed by your caregiver. SEEK MEDICAL CARE IF:   Your condition does not improve in the time expected.  You have a hard time doing your normal activities even with rest.  You have any side effects or problems with the medicines prescribed.  You develop any new symptoms. SEEK IMMEDIATE MEDICAL CARE IF:   Your shortness of breath gets worse.  You feel lightheaded, faint, or develop a cough not controlled with medicines.  You start coughing up blood.  You have pain with breathing.  You have chest pain or pain in your arms, shoulders, or abdomen.  You have a fever.  You are unable to walk up stairs or exercise the way you normally do. MAKE SURE YOU:  Understand these instructions.  Will watch your condition.  Will get help right away if you are not doing well or get worse. Document Released: 11/11/2000 Document Revised: 08/18/2011 Document Reviewed: 05/04/2011 [facility] Patient Information 2014 [place], [place]. Viral Gastroenteritis Viral gastroenteritis is also known as stomach flu. This condition affects the stomach and intestinal tract. It can cause sudden diarrhea and vomiting. The illness typically lasts 3 to 8 days. Most people develop an immune response that eventually gets rid of the virus. While this natural response develops, the virus can make you quite ill. CAUSES  Many different viruses can cause gastroenteritis, such as rotavirus or noroviruses. You can catch one of these viruses by consuming contaminated food or water. You may also catch a virus by sharing utensils or other personal items with an infected person or by touching a contaminated  surface. SYMPTOMS  The most common symptoms are diarrhea and vomiting. These problems can cause a severe loss of body fluids (dehydration) and a body salt (electrolyte) imbalance. Other symptoms may include:  Fever.  Headache.  Fatigue.  Abdominal pain. DIAGNOSIS  Your caregiver can usually diagnose  viral gastroenteritis based on your symptoms and a physical exam. A stool sample may also be taken to test for the presence of viruses or other infections. TREATMENT  This illness typically goes away on its own. Treatments are aimed at rehydration. The most serious cases of viral gastroenteritis involve vomiting so severely that you are not able to keep fluids down. In these cases, fluids must be given through an intravenous line (IV). HOME CARE INSTRUCTIONS   Drink enough fluids to keep your urine clear or pale yellow. Drink small amounts of fluids frequently and increase the amounts as tolerated.  Ask your caregiver for specific rehydration instructions.  Avoid:  Foods high in sugar.  Alcohol.  Carbonated drinks.  Tobacco.  Juice.  Caffeine drinks.  Extremely hot or cold fluids.  Fatty, greasy foods.  Too much intake of anything at one time.  Dairy products until 24 to 48 hours after diarrhea stops.  You may consume probiotics. Probiotics are active cultures of beneficial bacteria. They may lessen the amount and number of diarrheal stools in adults. Probiotics can be found in yogurt with active cultures and in supplements.  Wash your hands well to avoid spreading the virus.  Only take over-the-counter or prescription medicines for pain, discomfort, or fever as directed by your caregiver. Do not give aspirin to children. Antidiarrheal medicines are not recommended.  Ask your caregiver if you should continue to take your regular prescribed and over-the-counter medicines.  Keep all follow-up appointments as directed by your caregiver. SEEK IMMEDIATE MEDICAL CARE IF:   You are unable to keep fluids down.  You do not urinate at least once every 6 to 8 hours.  You develop shortness of breath.  You notice blood in your stool or vomit. This may look like coffee grounds.  You have abdominal pain that increases or is concentrated in one small area (localized).  You have  persistent vomiting or diarrhea.  You have a fever.  The patient is a child younger than 3 months, and he or she has a fever.  The patient is a child older than 3 months, and he or she has a fever and persistent symptoms.  The patient is a child older than 3 months, and he or she has a fever and symptoms suddenly get worse.  The patient is a baby, and he or she has no tears when crying. MAKE SURE YOU:   Understand these instructions.  Will watch your condition.  Will get help right away if you are not doing well or get worse. Document Released: 02/16/2005 Document Revised: 05/11/2011 Document Reviewed: 12/03/2010 [facility] Patient Information 2014 [place].

## 2013-05-15 NOTE — ED Provider Notes (Signed)
CSN: [id number]     Arrival date & time 05/15/13  0912 History   First MD Initiated Contact with Patient 05/15/13 [phone]     Chief Complaint  Patient presents with  . Diarrhea     (Consider location/radiation/quality/duration/timing/severity/associated sxs/prior Treatment) HPI  This is an 80 year old female with a history of asthma and recent memory changes who presents with diarrhea. Patient was seen and evaluated yesterday for nausea, vomiting, and diarrhea. Workup was reassuring. At that time she also endorsed some shortness of breath. EKG showed left bundle branch block and a negative troponin. No prior EKG. Patient reports that since discharge she has had one large episode of diarrhea. She denies any continued nausea or vomiting. She's currently taking Zofran and Keflex for urinary tract infection diagnosed yesterday. She continues to endorse tightness of her chest which she states was present yesterday.  Patient denies any blood in her stool. She has not been taking Imodium. Patient reports that other residents of her living facility have had similar symptoms.    Past Medical History  Diagnosis Date  . Asthma    No past surgical history on file. No family history on file. History  Substance Use Topics  . Smoking status: Never Smoker   . Smokeless tobacco: Not on file  . Alcohol Use: No   OB History   Grav Para Term Preterm Abortions TAB SAB Ect Mult Living                 Review of Systems  Constitutional: Negative for fever and chills.  Respiratory: Positive for chest tightness and shortness of breath. Negative for cough.   Cardiovascular: Positive for chest pain.  Gastrointestinal: Positive for diarrhea. Negative for nausea, vomiting, abdominal pain, constipation and blood in stool.  Genitourinary: Negative for dysuria.  Musculoskeletal: Negative for back pain.  Skin: Negative for wound.  Neurological: Negative for headaches.  Psychiatric/Behavioral: Negative for  confusion.  All other systems reviewed and are negative.      Allergies  Review of patient's allergies indicates no known allergies.  Home Medications   Current Outpatient Rx  Name  Route  Sig  Dispense  Refill  . albuterol (PROVENTIL HFA;VENTOLIN HFA) 108 (90 BASE) MCG/ACT inhaler   Inhalation   Inhale 2 puffs into the lungs every 6 (six) hours as needed for wheezing or shortness of breath.         . allopurinol (ZYLOPRIM) 300 MG tablet   Oral   Take 300 mg by mouth daily.         . Ascorbic Acid (VITAMIN C PO)   Oral   Take 1 tablet by mouth daily.         . budesonide (PULMICORT) 180 MCG/ACT inhaler   Inhalation   Inhale 2 puffs into the lungs daily.         Marland Kitchen CALCIUM PO   Oral   Take 1 tablet by mouth daily.         . cephALEXin (KEFLEX) 500 MG capsule   Oral   Take 1 capsule (500 mg total) by mouth 3 (three) times daily.   21 capsule   0   . Cholecalciferol (VITAMIN D PO)   Oral   Take 1 tablet by mouth daily.         Marland Kitchen lovastatin (MEVACOR) 40 MG tablet   Oral   Take 40 mg by mouth at bedtime.         . Multiple Vitamin (MULTIVITAMIN WITH MINERALS) TABS  tablet   Oral   Take 1 tablet by mouth daily.         . ondansetron (ZOFRAN) 4 MG tablet   Oral   Take 1 tablet (4 mg total) by mouth every 8 (eight) hours as needed for nausea or vomiting.   10 tablet   0   . predniSONE (DELTASONE) 10 MG tablet   Oral   Take 10 mg by mouth daily with breakfast.         . loperamide (IMODIUM) 2 MG capsule   Oral   Take 2 capsules (4 mg total) by mouth as needed for diarrhea or loose stools.   6 capsule   0    BP 149/91  Pulse 90  Temp(Src) 98.9 F (37.2 C) (Oral)  Resp 20  SpO2 96% Physical Exam  Nursing note and vitals reviewed. Constitutional: She is oriented to person, place, and time.  Elderly, hard of hearing  HENT:  Head: Normocephalic and atraumatic.  MM dry  Eyes: Pupils are equal, round, and reactive to light.  Neck:  Neck supple.  Cardiovascular: Normal rate, regular rhythm and normal heart sounds.   No murmur heard. Pulmonary/Chest: Effort normal and breath sounds normal. No respiratory distress. She has no wheezes.  Abdominal: Soft. Bowel sounds are normal. There is no tenderness. There is no rebound and no guarding.  Musculoskeletal: She exhibits no edema.  Neurological: She is alert and oriented to person, place, and time.  Skin: Skin is warm and dry.  Psychiatric: She has a normal mood and affect.    ED Course  Procedures (including critical care time) Labs Review Labs Reviewed  CBC WITH DIFFERENTIAL - Abnormal; Notable for the following:    WBC 11.5 (*)    Neutrophils Relative % 79 (*)    Neutro Abs 9.1 (*)    All other components within normal limits  COMPREHENSIVE METABOLIC PANEL - Abnormal; Notable for the following:    Potassium 3.5 (*)    Glucose, Bld 103 (*)    BUN 26 (*)    Creatinine, Ser 1.26 (*)    Total Protein 5.9 (*)    Albumin 3.0 (*)    GFR calc non Af Amer 37 (*)    GFR calc Af Amer 43 (*)    All other components within normal limits  D-DIMER, QUANTITATIVE - Abnormal; Notable for the following:    D-Dimer, Quant 1.13 (*)    All other components within normal limits  TROPONIN I   Imaging Review Ct Angio Chest W/cm &/or Wo Cm  05/15/2013   CLINICAL DATA:  Hypoxia. Shortness of breath. Elevated D-dimer. Chest heaviness.  EXAM: CT ANGIOGRAPHY CHEST WITH CONTRAST  TECHNIQUE: Multidetector CT imaging of the chest was performed using the standard protocol during bolus administration of intravenous contrast. Multiplanar CT image reconstructions and MIPs were obtained to evaluate the vascular anatomy.  CONTRAST:  80mL OMNIPAQUE IOHEXOL 350 MG/ML SOLN  COMPARISON:  No priors.  Chest x-ray 05/14/2013.  FINDINGS: Mediastinum: There are no filling defects within the pulmonary arterial tree to suggest underlying pulmonary embolism. Heart size is normal. There is no significant  pericardial fluid, thickening or pericardial calcification. There is atherosclerosis of the thoracic aorta, the great vessels of the mediastinum and the coronary arteries, including calcified atherosclerotic plaque in the left main, left anterior descending and right coronary arteries. No pathologically enlarged mediastinal are hilar lymph nodes. Small hiatal hernia.  Lungs/Pleura: Mild diffuse bronchial wall thickening. Patchy areas of peribronchovascular interstitial thickening,  ground-glass attenuation and micronodularity, some of which forms into confluent airspace disease (most pronounced in the inferior segment of the lingula). No other larger more suspicious appearing pulmonary nodules or masses are noted. No significant pleural effusions.  Upper Abdomen: Status post cholecystectomy. 1.7 cm simple cyst in the upper pole of the left kidney incidentally noted.  Musculoskeletal: Compression fracture of superior endplate of L1 with approximately 20% loss of anterior vertebral body height. There are no aggressive appearing lytic or blastic lesions noted in the visualized portions of the skeleton.  Review of the MIP images confirms the above findings.  IMPRESSION: 1. No evidence of pulmonary embolism. 2. Mild diffuse bronchial wall thickening with patchy areas of thickening of the peribronchovascular interstitium, peribronchovascular ground-glass attenuation and micronodularity, likely to reflect a bronchitis with developing multilobar bronchopneumonia, most confluent in the inferior segment of the lingula at this time. 3. Atherosclerosis, including left main and 2 vessel coronary artery disease. 4. Small hiatal hernia.   Electronically Signed   By: Trudie Reed M.D.   On: 05/15/2013 14:09   Dg Chest Portable 1 View  05/14/2013   CLINICAL DATA:  Cough, shortness of breath, weakness  EXAM: PORTABLE CHEST - 1 VIEW  COMPARISON:  None.  FINDINGS: The heart size and mediastinal contours are within normal limits.  Both lungs are clear. The visualized skeletal structures are unremarkable. Lungs are hypoaerated with crowding of the bronchovascular markings. The aorta is unfolded and ectatic. This likely accounts for prominence of the right peritracheal stripe along with rotation to the right. Right glenohumeral joint degenerative change noted.  IMPRESSION: Cardiomegaly without focal acute finding. Lungs are hypoaerated with crowding of the bronchovascular markings. If symptoms persist, consider PA and lateral chest radiographs obtained at full inspiration when the patient is clinically able.   Electronically Signed   By: Christiana Pellant M.D.   On: 05/14/2013 07:43     EKG Interpretation   Date/Time:  Monday May 15 2013 11:03:49 EDT Ventricular Rate:  87 PR Interval:  148 QRS Duration: 126 QT Interval:  379 QTC Calculation: 456 R Axis:   -4 Text Interpretation:  Sinus rhythm Left bundle branch block NO SIGNIFICANT  CHANGE SINCE LAST TRACING YESTERDAY Confirmed by HORTON  MD, COURTNEY  ([id number]) on 05/15/2013 11:07:04 AM      MDM   Final diagnoses:  Diarrhea  SOB (shortness of breath)    Patient presents with diarrhea. Evaluated yesterday for the same. Patient also endorses shortness of breath. Work up yesterday unremarkable.  Patient mildly hypoxic with a SpO2 92%. No obvious distress.  D-dimer was sent. Suspect patient's diarrhea is related to gastroenteritis with known sick contacts. Repeat lab work is reassuring. D-dimer is elevated. CTA is negative for acute PE. EKG shows left bundle with no known prior accident yesterday. Given her current complaints of shortness of breath, will referred to cardiology. Patient was able to by mouth hydrate. Have added Imodium.  After history, exam, and medical workup I feel the patient has been appropriately medically screened and is safe for discharge home. Pertinent diagnoses were discussed with the patient. Patient was given return precautions.     Shon Baton, MD 05/15/13 [phone]

## 2013-05-16 LAB — URINE CULTURE
Colony Count: 80000
Special Requests: NORMAL

## 2013-05-18 LAB — STOOL CULTURE: Special Requests: NORMAL

## 2013-05-24 DIAGNOSIS — N390 Urinary tract infection, site not specified: Secondary | ICD-10-CM | POA: Diagnosis not present

## 2013-05-24 DIAGNOSIS — R197 Diarrhea, unspecified: Secondary | ICD-10-CM | POA: Diagnosis not present

## 2013-05-24 DIAGNOSIS — J45909 Unspecified asthma, uncomplicated: Secondary | ICD-10-CM | POA: Diagnosis not present

## 2013-05-24 DIAGNOSIS — M25559 Pain in unspecified hip: Secondary | ICD-10-CM | POA: Diagnosis not present

## 2013-05-29 ENCOUNTER — Ambulatory Visit (INDEPENDENT_AMBULATORY_CARE_PROVIDER_SITE_OTHER): Payer: Medicare Other | Admitting: Family Medicine

## 2013-05-29 ENCOUNTER — Encounter: Payer: Self-pay | Admitting: Family Medicine

## 2013-05-29 VITALS — BP 143/97 | Ht 64.5 in | Wt 150.0 lb

## 2013-05-29 DIAGNOSIS — G8929 Other chronic pain: Secondary | ICD-10-CM | POA: Diagnosis not present

## 2013-05-29 DIAGNOSIS — M533 Sacrococcygeal disorders, not elsewhere classified: Secondary | ICD-10-CM | POA: Diagnosis not present

## 2013-05-31 DIAGNOSIS — M533 Sacrococcygeal disorders, not elsewhere classified: Secondary | ICD-10-CM | POA: Insufficient documentation

## 2013-05-31 NOTE — Progress Notes (Signed)
   Subjective:    Patient ID: Michele SchimkeAnn A Maxwell, female    DOB: 08/21/1922, 80 y.o.   MRN: [id number]  HPI  To 6 weeks of left posterior hip and buttock pain. Polisher mostly at night. She's never had an injury here. Also bothers her with walking and standing. Pain is aching in nature. Does not radiate down her leg. She's had no bowel or bladder incontinence or changes.  Review of Systems Denies fever, sweats, chills, unusual weight change.    Objective:   Physical Exam  Vital signs are reviewed GENERAL: Well-developed elderly female no acute distress HIPS: I laterally internal/external rotation is full and normal. No pain with movement. Hip flexion extension is normal strength. Distally she is neurovascularly intact. GAIT: Very short in stride length consistent with Parkinsonian type syndrome. BUTTOCK: Tender to palpation over the top of the SI joint and this reproduces her pain as does pelvic rock.      Assessment & Plan:  SI joint pain, acute on chronic most likely. Offered her corticosteroid injection she did not want to do that. We initially talked about physical therapy but ultimately she would like to pursue other intervention such as manipulation. I will refer her to osteopathic for manipulation and order physical therapy. She can followup when necessary

## 2013-06-06 ENCOUNTER — Ambulatory Visit (INDEPENDENT_AMBULATORY_CARE_PROVIDER_SITE_OTHER): Payer: Medicare Other | Admitting: Family Medicine

## 2013-06-06 ENCOUNTER — Encounter: Payer: Self-pay | Admitting: Family Medicine

## 2013-06-06 VITALS — BP 140/84 | HR 89 | Wt 164.0 lb

## 2013-06-06 DIAGNOSIS — M999 Biomechanical lesion, unspecified: Secondary | ICD-10-CM | POA: Insufficient documentation

## 2013-06-06 DIAGNOSIS — M533 Sacrococcygeal disorders, not elsewhere classified: Secondary | ICD-10-CM

## 2013-06-06 NOTE — Patient Instructions (Signed)
Very nice to meet you Try following exercsies most days of the week Sacroiliac Joint Mobilization and Rehab 1. Work on pretzel stretching, shoulder back and leg draped in front. 3-5 sets, 30 sec..  2. rolling up and back knees to chest and rocking. 3. sacral tilt - 5 sets, hold for 5-10 seconds  Take tylenol 650 mg three times a day is the best evidence based medicine we have for arthritis.  Glucosamine sulfate 750mg  twice a day is a supplement that has been shown to help moderate to severe arthritis. Vitamin D 2000 IU daily Fish oil 2 grams daily.  Tumeric 500mg  twice daily.  Capsaicin topically up to four times a day may also help with pain. Cortisone injections are an option if these interventions do not seem to make a difference or need more relief.  It's important that you continue to stay active. Controlling your weight is important.  Consider physical therapy to strengthen muscles around the joint that hurts to take pressure off of the joint itself. Shoe inserts with good arch support may be helpful.  [employer] [facility] [employer] could help.  Water aerobics and cycling with low resistance are the best two types of exercise for arthritis. Come back and see me in 3 weeks.

## 2013-06-06 NOTE — Assessment & Plan Note (Signed)
Discussed diagnosis and likely this is secondary to osteoarthritic changes. Patient given recommendations of over-the-counter medications that can be beneficial. Patient given home exercise program with range of motion exercises that could be helpful. We discussed icing protocol. He should did respond fairly well to the osteopathic manipulation and did have better stride length immediately. Patient will come back again in 3 weeks for further evaluation and the longus osteopathic manipulation is helpful we will continue with this treatment option.

## 2013-06-06 NOTE — Assessment & Plan Note (Signed)
Decision today to treat with OMT was based on Physical Exam  After verbal consent patient was treated with ME and soft tissuetechniques in  sacral areas  Patient tolerated the procedure well with improvement in symptoms  Patient given exercises, stretches and lifestyle modifications  See medications in patient instructions if given  Patient will follow up in 3 weeks

## 2013-06-06 NOTE — Progress Notes (Signed)
  Tawana ScaleZach Smith D.O. [facility] [address] [place], [place] [address] Phone: [phone]) [phone] Subjective:    I'm seeing this patient by the request  of:  Dr. Jennette KettleNeal  CC: Left sacroiliac joint pain  NUU:[id number]:Subjective Michele Schimkenn A Maxwell is a 80 y.o. female coming in with complaint of low back pain on left side for multiple years. Patient states that this pain has been there for quite some time and does not remember any true injury. Patient states that the pain seems to be very localized mostly being over the left sacroiliac joint is where she is pointing. Patient states that he can occur with walking or standing for a long amount of time. Patient denies any radiation down the leg, any numbness, or any tingling. Patient also denies any bowel or bladder incontinence. Patient states that it is more of a dull aching sensation that can be sharp with certain movements. Patient has taken Advil for it which does seem to be helpful. Patient is wondering if there's anything of she can do to make it feel a little bit better.     Past medical history, social, surgical and family history all reviewed in electronic medical record.   Review of Systems: No headache, visual changes, nausea, vomiting, diarrhea, constipation, dizziness, abdominal pain, skin rash, fevers, chills, night sweats, weight loss, swollen lymph nodes, body aches, joint swelling, muscle aches, chest pain, shortness of breath, mood changes.   Objective Blood pressure 140/84, pulse 89, weight 164 lb (74.39 kg), SpO2 96.00%.  General: No apparent distress alert and oriented x3 mood and affect normal, dressed appropriately. Overweight hard of hearing.  HEENT: Pupils equal, extraocular movements intact  Respiratory: Patient's speak in full sentences and does not appear short of breath  Cardiovascular: No lower extremity edema, non tender, no erythema  Skin: Warm dry intact with no signs of infection or rash on extremities or on axial skeleton.    Abdomen: Soft nontender poor core strength Neuro: Cranial nerves II through XII are intact, neurovascularly intact in all extremities with 2+ DTRs and 2+ pulses.  Lymph: No lymphadenopathy of posterior or anterior cervical chain or axillae bilaterally.  Gait antalgic gait with shortening of her stride on the left side  MSK:  Non tender with full range of motion and good stability and symmetric strength and tone of shoulders, elbows, wrist, hip, knee and ankles bilaterally. Patient does have osteoarthritic changes of multiple joints Back Exam:  Inspection: Unremarkable  Motion: Flexion 45 deg, Extension 45 deg, Side Bending to 45 deg bilaterally,  Rotation to 45 deg bilaterally  SLR laying: Negative  XSLR laying: Negative  Palpable tenderness: Tenderness over the left SI joint. FABER: Positive  Left side Sensory change: Gross sensation intact to all lumbar and sacral dermatomes.  Reflexes: 2+ at both patellar tendons, 2+ at achilles tendons, Babinski's downgoing.  Strength at foot  Plantar-flexion: 5/5 Dorsi-flexion: 5/5 Eversion: 5/5 Inversion: 5/5  Osteopathic findings Sacrum left on left   Impression and Recommendations:     This case required medical decision making of moderate complexity.

## 2013-06-13 DIAGNOSIS — J45909 Unspecified asthma, uncomplicated: Secondary | ICD-10-CM | POA: Diagnosis not present

## 2013-06-13 DIAGNOSIS — Z1331 Encounter for screening for depression: Secondary | ICD-10-CM | POA: Diagnosis not present

## 2013-06-13 DIAGNOSIS — J309 Allergic rhinitis, unspecified: Secondary | ICD-10-CM | POA: Diagnosis not present

## 2013-06-13 DIAGNOSIS — R5383 Other fatigue: Secondary | ICD-10-CM | POA: Diagnosis not present

## 2013-06-13 DIAGNOSIS — R197 Diarrhea, unspecified: Secondary | ICD-10-CM | POA: Diagnosis not present

## 2013-06-13 DIAGNOSIS — M76899 Other specified enthesopathies of unspecified lower limb, excluding foot: Secondary | ICD-10-CM | POA: Diagnosis not present

## 2013-06-13 DIAGNOSIS — R5381 Other malaise: Secondary | ICD-10-CM | POA: Diagnosis not present

## 2013-06-27 ENCOUNTER — Ambulatory Visit (INDEPENDENT_AMBULATORY_CARE_PROVIDER_SITE_OTHER): Payer: Medicare Other | Admitting: Family Medicine

## 2013-06-27 ENCOUNTER — Encounter: Payer: Self-pay | Admitting: Family Medicine

## 2013-06-27 VITALS — BP 130/84 | HR 86 | Wt 159.0 lb

## 2013-06-27 DIAGNOSIS — M533 Sacrococcygeal disorders, not elsewhere classified: Secondary | ICD-10-CM

## 2013-06-27 DIAGNOSIS — M999 Biomechanical lesion, unspecified: Secondary | ICD-10-CM

## 2013-06-27 NOTE — Progress Notes (Signed)
  Tawana ScaleZach Smith D.O. [facility] [address] [place], [place] [address] Phone: [phone](336) [phone] Subjective:    I'm seeing this patient by the request  of:  Dr. Jennette KettleNeal  CC: Left sacroiliac joint pain  NWG:[id number]:Subjective Michele Maxwell is a 80 y.o. female coming in with complaint of low back pain. Patient was seen previously and was diagnosed with a sacroiliac joint dysfunction. In addition this patient did have osteopathic manipulation and did have some good results with the manipulation some thereafter. Patient was given a series of home exercises as well as an icing protocol and over-the-counter medications that could be beneficial. Patient states that she's not doing exercises on a regular basis. Patient states that the pain seems to be about the same it may be getting little bit worse. Patient states that she does have some radiation going down her left leg from time to time. Still very localized over the left SI joint. Patient does not know if the manipulation was very helpful and states that she is in too much pain today to have the manipulation the     Past medical history, social, surgical and family history all reviewed in electronic medical record.   Review of Systems: No headache, visual changes, nausea, vomiting, diarrhea, constipation, dizziness, abdominal pain, skin rash, fevers, chills, night sweats, weight loss, swollen lymph nodes, body aches, joint swelling, muscle aches, chest pain, shortness of breath, mood changes.   Objective Blood pressure 130/84, pulse 86, weight 159 lb (72.122 kg), SpO2 97.00%.  General: No apparent distress alert and oriented x3 mood and affect normal, dressed appropriately. Overweight hard of hearing.  HEENT: Pupils equal, extraocular movements intact  Respiratory: Patient's speak in full sentences and does not appear short of breath  Cardiovascular: No lower extremity edema, non tender, no erythema  Skin: Warm dry intact with no signs of infection or  rash on extremities or on axial skeleton.  Abdomen: Soft nontender poor core strength Neuro: Cranial nerves II through XII are intact, neurovascularly intact in all extremities with 2+ DTRs and 2+ pulses.  Lymph: No lymphadenopathy of posterior or anterior cervical chain or axillae bilaterally.  Gait antalgic gait with shortening of her stride on the left side  MSK:  Non tender with full range of motion and good stability and symmetric strength and tone of shoulders, elbows, wrist, hip, knee and ankles bilaterally. Patient does have osteoarthritic changes of multiple joints Back Exam:  Inspection: Unremarkable  Motion: Flexion 45 deg, Extension 45 deg, Side Bending to 45 deg bilaterally,  Rotation to 45 deg bilaterally  SLR laying: Negative  XSLR laying: Negative  Palpable tenderness: Tenderness over the left SI joint. FABER: Positive  Left side Sensory change: Gross sensation intact to all lumbar and sacral dermatomes.  Reflexes: 2+ at both patellar tendons, 2+ at achilles tendons, Babinski's downgoing.  Strength at foot  Plantar-flexion: 5/5 Dorsi-flexion: 5/5 Eversion: 5/5 Inversion: 5/5   After verbal consent patient was prepped with alcohol swabs and with a 23-gauge 2 inch needle was injected with 1 cc of 0.5% Marcaine and 1 cc of 40 mg/dL Kenalog into the left SI joint. Patient tolerated the procedure extremely well. Due to the difficulty of this injection this was on ultrasound guidance. Post injection instructions given.   Impression and Recommendations:     This case required medical decision making of moderate complexity.

## 2013-06-27 NOTE — Assessment & Plan Note (Signed)
Patient did have injection today. Patient did have almost near complete resolution of pain. Patient encouraged to do the exercises on her regular basis we discussed formal physical therapy which she declined. Discussed icing protocol. Patient will come back again in 4 weeks. If she is doing better we can start to osteopathic manipulation again.

## 2013-06-27 NOTE — Patient Instructions (Signed)
It is good to see you both.  Continue the exercises 2-3 times a week.  Ice 20 minutes after the exercises can help.  We did injection today and will try manipulation again in 4 weeks when you come and see me.   Sacroiliac Joint Mobilization and Rehab 1. Work on pretzel stretching, shoulder back and leg draped in front. 3-5 sets, 30 sec.. 2. hip abductor rotations. standing, hip flexion and rotation outward then inward. 3 sets, 15 reps. when can do comfortably, add ankle weights starting at 2 pounds.  3. cross over stretching - shoulder back to ground, same side leg crossover. 3-5 sets for 30 min..  4. rolling up and back knees to chest and rocking. 5. sacral tilt - 5 sets, hold for 5-10 seconds

## 2013-06-27 NOTE — Assessment & Plan Note (Signed)
Decision today to treat with OMT was based on Physical Exam  After verbal consent patient was treated with ME and soft tissuetechniques in  sacral areas  Patient tolerated the procedure well with improvement in symptoms  Patient given exercises, stretches and lifestyle modifications  See medications in patient instructions if given  Patient will follow up in 4 weeks

## 2013-07-25 ENCOUNTER — Ambulatory Visit (INDEPENDENT_AMBULATORY_CARE_PROVIDER_SITE_OTHER): Payer: Medicare Other | Admitting: Family Medicine

## 2013-07-25 ENCOUNTER — Encounter: Payer: Self-pay | Admitting: Family Medicine

## 2013-07-25 VITALS — BP 130/84 | HR 89 | Ht 64.5 in | Wt 158.0 lb

## 2013-07-25 DIAGNOSIS — M533 Sacrococcygeal disorders, not elsewhere classified: Secondary | ICD-10-CM

## 2013-07-25 NOTE — Assessment & Plan Note (Signed)
Patient is doing extremities at this time. Patient will follow up on an as-needed basis. Encourage her to do the exercises which is not doing at this time. We can repeat injection into the sacroiliac joint every 3-4 months as necessary. Patient as well as son had all questions answered.

## 2013-07-25 NOTE — Progress Notes (Signed)
  [facility] [address] [place], [place] [address] Phone: [phone] Subjective:     CC: Left sacroiliac joint pain follow up  [id number] Michele Maxwell is a 80 y.o. female coming in with complaint of low back pain.  Patient has been diagnosed with left sacroiliac joint dysfunction. Patient did try manipulation did not have any significant improvement. At last visit patient did have an injection in the left SI joint. Patient states it all the pain has completely resolved at this time. Patient is able to do all activities of daily living and sleeping comfortably. Patient denies any radiation of pain any fevers or chills or any abnormal weight loss. Patient states that she is 99% better.     Past medical history, social, surgical and family history all reviewed in electronic medical record.   Review of Systems: No headache, visual changes, nausea, vomiting, diarrhea, constipation, dizziness, abdominal pain, skin rash, fevers, chills, night sweats, weight loss, swollen lymph nodes, body aches, joint swelling, muscle aches, chest pain, shortness of breath, mood changes.   Objective Blood pressure 130/84, pulse 89, height 5' 4.5" (1.638 m), weight 158 lb (71.668 kg), SpO2 94.00%.  General: No apparent distress alert and oriented x3 mood and affect normal, dressed appropriately. Overweight hard of hearing.  HEENT: Pupils equal, extraocular movements intact  Respiratory: Patient's speak in full sentences and does not appear short of breath  Cardiovascular: No lower extremity edema, non tender, no erythema  Skin: Warm dry intact with no signs of infection or rash on extremities or on axial skeleton.  Abdomen: Soft nontender poor core strength Neuro: Cranial nerves II through XII are intact, neurovascularly intact in all extremities with 2+ DTRs and 2+ pulses.  Lymph: No lymphadenopathy of posterior or anterior cervical chain or axillae bilaterally.  Gait antalgic  gait with shortening of her stride on the left side  MSK:  Non tender with full range of motion and good stability and symmetric strength and tone of shoulders, elbows, wrist, hip, knee and ankles bilaterally. Patient does have osteoarthritic changes of multiple joints Back Exam:  Inspection: Unremarkable  Motion: Flexion 45 deg, Extension 45 deg, Side Bending to 45 deg bilaterally,  Rotation to 45 deg bilaterally  SLR laying: Negative  XSLR laying: Negative  Palpable tenderness: Mild Tenderness over the left SI joint. This is extremely better than previous visit FABER: Negative Sensory change: Gross sensation intact to all lumbar and sacral dermatomes.  Reflexes: 2+ at both patellar tendons, 2+ at achilles tendons, Babinski's downgoing.  Strength at foot  Plantar-flexion: 5/5 Dorsi-flexion: 5/5 Eversion: 5/5 Inversion: 5/5      Impression and Recommendations:     This case required medical decision making of moderate complexity.

## 2013-08-02 DIAGNOSIS — M109 Gout, unspecified: Secondary | ICD-10-CM | POA: Diagnosis not present

## 2013-08-02 DIAGNOSIS — R488 Other symbolic dysfunctions: Secondary | ICD-10-CM | POA: Diagnosis not present

## 2013-08-02 DIAGNOSIS — J42 Unspecified chronic bronchitis: Secondary | ICD-10-CM | POA: Diagnosis not present

## 2013-08-07 DIAGNOSIS — J42 Unspecified chronic bronchitis: Secondary | ICD-10-CM | POA: Diagnosis not present

## 2013-08-07 DIAGNOSIS — R488 Other symbolic dysfunctions: Secondary | ICD-10-CM | POA: Diagnosis not present

## 2013-08-07 DIAGNOSIS — M109 Gout, unspecified: Secondary | ICD-10-CM | POA: Diagnosis not present

## 2013-08-09 DIAGNOSIS — M109 Gout, unspecified: Secondary | ICD-10-CM | POA: Diagnosis not present

## 2013-08-09 DIAGNOSIS — R488 Other symbolic dysfunctions: Secondary | ICD-10-CM | POA: Diagnosis not present

## 2013-08-09 DIAGNOSIS — J42 Unspecified chronic bronchitis: Secondary | ICD-10-CM | POA: Diagnosis not present

## 2013-08-14 DIAGNOSIS — R488 Other symbolic dysfunctions: Secondary | ICD-10-CM | POA: Diagnosis not present

## 2013-08-14 DIAGNOSIS — J42 Unspecified chronic bronchitis: Secondary | ICD-10-CM | POA: Diagnosis not present

## 2013-08-14 DIAGNOSIS — M109 Gout, unspecified: Secondary | ICD-10-CM | POA: Diagnosis not present

## 2013-08-15 DIAGNOSIS — M109 Gout, unspecified: Secondary | ICD-10-CM | POA: Diagnosis not present

## 2013-08-15 DIAGNOSIS — R0989 Other specified symptoms and signs involving the circulatory and respiratory systems: Secondary | ICD-10-CM | POA: Diagnosis not present

## 2013-08-15 DIAGNOSIS — N184 Chronic kidney disease, stage 4 (severe): Secondary | ICD-10-CM | POA: Diagnosis not present

## 2013-08-15 DIAGNOSIS — L659 Nonscarring hair loss, unspecified: Secondary | ICD-10-CM | POA: Diagnosis not present

## 2013-08-16 ENCOUNTER — Other Ambulatory Visit: Payer: Self-pay | Admitting: Geriatric Medicine

## 2013-08-16 ENCOUNTER — Ambulatory Visit
Admission: RE | Admit: 2013-08-16 | Discharge: 2013-08-16 | Disposition: A | Discharge status: Home / Self Care | Payer: Medicare Other | Source: Ambulatory Visit | Attending: Geriatric Medicine | Admitting: Geriatric Medicine

## 2013-08-16 DIAGNOSIS — J45909 Unspecified asthma, uncomplicated: Secondary | ICD-10-CM | POA: Diagnosis not present

## 2013-08-16 DIAGNOSIS — R0989 Other specified symptoms and signs involving the circulatory and respiratory systems: Secondary | ICD-10-CM

## 2013-08-21 DIAGNOSIS — M109 Gout, unspecified: Secondary | ICD-10-CM | POA: Diagnosis not present

## 2013-08-21 DIAGNOSIS — J42 Unspecified chronic bronchitis: Secondary | ICD-10-CM | POA: Diagnosis not present

## 2013-08-21 DIAGNOSIS — R488 Other symbolic dysfunctions: Secondary | ICD-10-CM | POA: Diagnosis not present

## 2013-08-23 DIAGNOSIS — J42 Unspecified chronic bronchitis: Secondary | ICD-10-CM | POA: Diagnosis not present

## 2013-08-23 DIAGNOSIS — R488 Other symbolic dysfunctions: Secondary | ICD-10-CM | POA: Diagnosis not present

## 2013-08-23 DIAGNOSIS — M109 Gout, unspecified: Secondary | ICD-10-CM | POA: Diagnosis not present

## 2013-08-30 DIAGNOSIS — J42 Unspecified chronic bronchitis: Secondary | ICD-10-CM | POA: Diagnosis not present

## 2013-08-30 DIAGNOSIS — R488 Other symbolic dysfunctions: Secondary | ICD-10-CM | POA: Diagnosis not present

## 2013-08-30 DIAGNOSIS — M109 Gout, unspecified: Secondary | ICD-10-CM | POA: Diagnosis not present

## 2013-08-30 DIAGNOSIS — M6281 Muscle weakness (generalized): Secondary | ICD-10-CM | POA: Diagnosis not present

## 2013-08-31 DIAGNOSIS — R488 Other symbolic dysfunctions: Secondary | ICD-10-CM | POA: Diagnosis not present

## 2013-08-31 DIAGNOSIS — M6281 Muscle weakness (generalized): Secondary | ICD-10-CM | POA: Diagnosis not present

## 2013-08-31 DIAGNOSIS — M109 Gout, unspecified: Secondary | ICD-10-CM | POA: Diagnosis not present

## 2013-08-31 DIAGNOSIS — J42 Unspecified chronic bronchitis: Secondary | ICD-10-CM | POA: Diagnosis not present

## 2013-09-06 DIAGNOSIS — R488 Other symbolic dysfunctions: Secondary | ICD-10-CM | POA: Diagnosis not present

## 2013-09-06 DIAGNOSIS — M6281 Muscle weakness (generalized): Secondary | ICD-10-CM | POA: Diagnosis not present

## 2013-09-06 DIAGNOSIS — M109 Gout, unspecified: Secondary | ICD-10-CM | POA: Diagnosis not present

## 2013-09-06 DIAGNOSIS — J42 Unspecified chronic bronchitis: Secondary | ICD-10-CM | POA: Diagnosis not present

## 2013-09-08 DIAGNOSIS — M6281 Muscle weakness (generalized): Secondary | ICD-10-CM | POA: Diagnosis not present

## 2013-09-08 DIAGNOSIS — R488 Other symbolic dysfunctions: Secondary | ICD-10-CM | POA: Diagnosis not present

## 2013-09-08 DIAGNOSIS — J42 Unspecified chronic bronchitis: Secondary | ICD-10-CM | POA: Diagnosis not present

## 2013-09-08 DIAGNOSIS — M109 Gout, unspecified: Secondary | ICD-10-CM | POA: Diagnosis not present

## 2013-09-11 DIAGNOSIS — M109 Gout, unspecified: Secondary | ICD-10-CM | POA: Diagnosis not present

## 2013-09-11 DIAGNOSIS — R488 Other symbolic dysfunctions: Secondary | ICD-10-CM | POA: Diagnosis not present

## 2013-09-11 DIAGNOSIS — M6281 Muscle weakness (generalized): Secondary | ICD-10-CM | POA: Diagnosis not present

## 2013-09-11 DIAGNOSIS — J42 Unspecified chronic bronchitis: Secondary | ICD-10-CM | POA: Diagnosis not present

## 2013-09-12 DIAGNOSIS — N184 Chronic kidney disease, stage 4 (severe): Secondary | ICD-10-CM | POA: Diagnosis not present

## 2013-09-12 DIAGNOSIS — M353 Polymyalgia rheumatica: Secondary | ICD-10-CM | POA: Diagnosis not present

## 2013-09-12 DIAGNOSIS — Z0000 Encounter for general adult medical examination without abnormal findings: Secondary | ICD-10-CM | POA: Diagnosis not present

## 2013-09-12 DIAGNOSIS — Z1331 Encounter for screening for depression: Secondary | ICD-10-CM | POA: Diagnosis not present

## 2013-09-13 DIAGNOSIS — M6281 Muscle weakness (generalized): Secondary | ICD-10-CM | POA: Diagnosis not present

## 2013-09-13 DIAGNOSIS — M109 Gout, unspecified: Secondary | ICD-10-CM | POA: Diagnosis not present

## 2013-09-13 DIAGNOSIS — R488 Other symbolic dysfunctions: Secondary | ICD-10-CM | POA: Diagnosis not present

## 2013-09-13 DIAGNOSIS — J42 Unspecified chronic bronchitis: Secondary | ICD-10-CM | POA: Diagnosis not present

## 2013-09-18 DIAGNOSIS — J42 Unspecified chronic bronchitis: Secondary | ICD-10-CM | POA: Diagnosis not present

## 2013-09-18 DIAGNOSIS — R488 Other symbolic dysfunctions: Secondary | ICD-10-CM | POA: Diagnosis not present

## 2013-09-18 DIAGNOSIS — M109 Gout, unspecified: Secondary | ICD-10-CM | POA: Diagnosis not present

## 2013-09-18 DIAGNOSIS — M6281 Muscle weakness (generalized): Secondary | ICD-10-CM | POA: Diagnosis not present

## 2013-09-20 DIAGNOSIS — R413 Other amnesia: Secondary | ICD-10-CM | POA: Diagnosis not present

## 2013-09-20 DIAGNOSIS — J42 Unspecified chronic bronchitis: Secondary | ICD-10-CM | POA: Diagnosis not present

## 2013-09-20 DIAGNOSIS — R488 Other symbolic dysfunctions: Secondary | ICD-10-CM | POA: Diagnosis not present

## 2013-09-20 DIAGNOSIS — M109 Gout, unspecified: Secondary | ICD-10-CM | POA: Diagnosis not present

## 2013-09-20 DIAGNOSIS — J45909 Unspecified asthma, uncomplicated: Secondary | ICD-10-CM | POA: Diagnosis not present

## 2013-09-20 DIAGNOSIS — M6281 Muscle weakness (generalized): Secondary | ICD-10-CM | POA: Diagnosis not present

## 2013-09-22 DIAGNOSIS — R488 Other symbolic dysfunctions: Secondary | ICD-10-CM | POA: Diagnosis not present

## 2013-09-22 DIAGNOSIS — M109 Gout, unspecified: Secondary | ICD-10-CM | POA: Diagnosis not present

## 2013-09-22 DIAGNOSIS — M6281 Muscle weakness (generalized): Secondary | ICD-10-CM | POA: Diagnosis not present

## 2013-09-22 DIAGNOSIS — J42 Unspecified chronic bronchitis: Secondary | ICD-10-CM | POA: Diagnosis not present

## 2013-09-25 DIAGNOSIS — M6281 Muscle weakness (generalized): Secondary | ICD-10-CM | POA: Diagnosis not present

## 2013-09-25 DIAGNOSIS — M109 Gout, unspecified: Secondary | ICD-10-CM | POA: Diagnosis not present

## 2013-09-25 DIAGNOSIS — J42 Unspecified chronic bronchitis: Secondary | ICD-10-CM | POA: Diagnosis not present

## 2013-09-25 DIAGNOSIS — R488 Other symbolic dysfunctions: Secondary | ICD-10-CM | POA: Diagnosis not present

## 2013-09-28 ENCOUNTER — Encounter (HOSPITAL_COMMUNITY): Payer: Self-pay | Admitting: Emergency Medicine

## 2013-09-28 ENCOUNTER — Emergency Department (HOSPITAL_COMMUNITY)
Admission: EM | Admit: 2013-09-28 | Discharge: 2013-09-28 | Disposition: A | Discharge status: Skilled Nursing Facility | Payer: Medicare Other | Attending: Emergency Medicine | Admitting: Emergency Medicine

## 2013-09-28 ENCOUNTER — Emergency Department (HOSPITAL_COMMUNITY): Payer: Medicare Other

## 2013-09-28 DIAGNOSIS — R05 Cough: Secondary | ICD-10-CM | POA: Diagnosis not present

## 2013-09-28 DIAGNOSIS — Z9181 History of falling: Secondary | ICD-10-CM | POA: Insufficient documentation

## 2013-09-28 DIAGNOSIS — Z79899 Other long term (current) drug therapy: Secondary | ICD-10-CM | POA: Diagnosis not present

## 2013-09-28 DIAGNOSIS — G8911 Acute pain due to trauma: Secondary | ICD-10-CM | POA: Diagnosis not present

## 2013-09-28 DIAGNOSIS — W010XXA Fall on same level from slipping, tripping and stumbling without subsequent striking against object, initial encounter: Secondary | ICD-10-CM | POA: Diagnosis not present

## 2013-09-28 DIAGNOSIS — F0390 Unspecified dementia without behavioral disturbance: Secondary | ICD-10-CM | POA: Diagnosis not present

## 2013-09-28 DIAGNOSIS — T148XXA Other injury of unspecified body region, initial encounter: Secondary | ICD-10-CM | POA: Diagnosis not present

## 2013-09-28 DIAGNOSIS — S0990XA Unspecified injury of head, initial encounter: Secondary | ICD-10-CM | POA: Diagnosis not present

## 2013-09-28 DIAGNOSIS — S199XXA Unspecified injury of neck, initial encounter: Secondary | ICD-10-CM

## 2013-09-28 DIAGNOSIS — W19XXXA Unspecified fall, initial encounter: Secondary | ICD-10-CM

## 2013-09-28 DIAGNOSIS — S99919A Unspecified injury of unspecified ankle, initial encounter: Secondary | ICD-10-CM | POA: Diagnosis not present

## 2013-09-28 DIAGNOSIS — S0993XA Unspecified injury of face, initial encounter: Secondary | ICD-10-CM | POA: Diagnosis not present

## 2013-09-28 DIAGNOSIS — R0602 Shortness of breath: Secondary | ICD-10-CM | POA: Diagnosis not present

## 2013-09-28 DIAGNOSIS — J45909 Unspecified asthma, uncomplicated: Secondary | ICD-10-CM | POA: Diagnosis not present

## 2013-09-28 DIAGNOSIS — IMO0002 Reserved for concepts with insufficient information to code with codable children: Secondary | ICD-10-CM | POA: Diagnosis not present

## 2013-09-28 DIAGNOSIS — E509 Vitamin A deficiency, unspecified: Secondary | ICD-10-CM | POA: Diagnosis not present

## 2013-09-28 DIAGNOSIS — R059 Cough, unspecified: Secondary | ICD-10-CM | POA: Diagnosis not present

## 2013-09-28 DIAGNOSIS — R197 Diarrhea, unspecified: Secondary | ICD-10-CM | POA: Diagnosis not present

## 2013-09-28 DIAGNOSIS — S99929A Unspecified injury of unspecified foot, initial encounter: Secondary | ICD-10-CM

## 2013-09-28 DIAGNOSIS — M542 Cervicalgia: Secondary | ICD-10-CM | POA: Diagnosis not present

## 2013-09-28 DIAGNOSIS — S8990XA Unspecified injury of unspecified lower leg, initial encounter: Secondary | ICD-10-CM | POA: Insufficient documentation

## 2013-09-28 DIAGNOSIS — S82002A Unspecified fracture of left patella, initial encounter for closed fracture: Secondary | ICD-10-CM

## 2013-09-28 DIAGNOSIS — Y9389 Activity, other specified: Secondary | ICD-10-CM | POA: Diagnosis not present

## 2013-09-28 DIAGNOSIS — M545 Low back pain, unspecified: Secondary | ICD-10-CM | POA: Diagnosis not present

## 2013-09-28 DIAGNOSIS — M109 Gout, unspecified: Secondary | ICD-10-CM | POA: Diagnosis not present

## 2013-09-28 DIAGNOSIS — M25569 Pain in unspecified knee: Secondary | ICD-10-CM | POA: Diagnosis not present

## 2013-09-28 DIAGNOSIS — S82009A Unspecified fracture of unspecified patella, initial encounter for closed fracture: Secondary | ICD-10-CM | POA: Insufficient documentation

## 2013-09-28 DIAGNOSIS — H902 Conductive hearing loss, unspecified: Secondary | ICD-10-CM | POA: Diagnosis not present

## 2013-09-28 DIAGNOSIS — Y92009 Unspecified place in unspecified non-institutional (private) residence as the place of occurrence of the external cause: Secondary | ICD-10-CM | POA: Diagnosis not present

## 2013-09-28 DIAGNOSIS — E569 Vitamin deficiency, unspecified: Secondary | ICD-10-CM | POA: Diagnosis not present

## 2013-09-28 DIAGNOSIS — K219 Gastro-esophageal reflux disease without esophagitis: Secondary | ICD-10-CM | POA: Diagnosis not present

## 2013-09-28 DIAGNOSIS — J42 Unspecified chronic bronchitis: Secondary | ICD-10-CM | POA: Diagnosis not present

## 2013-09-28 DIAGNOSIS — E559 Vitamin D deficiency, unspecified: Secondary | ICD-10-CM | POA: Diagnosis not present

## 2013-09-28 LAB — URINALYSIS, ROUTINE W REFLEX MICROSCOPIC
Bilirubin Urine: NEGATIVE
GLUCOSE, UA: NEGATIVE mg/dL
Ketones, ur: NEGATIVE mg/dL
NITRITE: NEGATIVE
PH: 7 (ref 5.0–8.0)
PROTEIN: 100 mg/dL — AB
Specific Gravity, Urine: 1.015 (ref 1.005–1.030)
Urobilinogen, UA: 0.2 mg/dL (ref 0.0–1.0)

## 2013-09-28 LAB — URINE MICROSCOPIC-ADD ON

## 2013-09-28 MED ORDER — HYDROCODONE-ACETAMINOPHEN 5-325 MG PO TABS: 1.0000 | ORAL_TABLET | Freq: Four times a day (QID) | ORAL | Status: DC | PRN

## 2013-09-28 NOTE — Progress Notes (Signed)
Orthopedic Tech Progress Note Patient Details:  Michele Maxwell 10/03/1922 [id number]  Ortho Devices Type of Ortho Device: Knee Immobilizer Ortho Device/Splint Interventions: Application   Cammer, Mickie BailJennifer Carol 09/28/2013, 11:51 AM

## 2013-09-28 NOTE — ED Notes (Signed)
c-collar removed, okay'd by Dr. Wilkie AyeHorton

## 2013-09-28 NOTE — ED Notes (Signed)
Patient transported to X-ray 

## 2013-09-28 NOTE — ED Notes (Signed)
Pt undressed, in gown, on continuous pulse oximetry and blood pressure cuff; c-collar still intact

## 2013-09-28 NOTE — ED Provider Notes (Signed)
CSN: [id number]     Arrival date & time 09/28/13  0940 History   First MD Initiated Contact with Patient 09/28/13 [phone]     Chief Complaint  Patient presents with  . Fall     (Consider location/radiation/quality/duration/timing/severity/associated sxs/prior Treatment) HPI  This is an 80 year old female with reported history of asthma and dementia who presents [facility] the [facility] following a [facility]. Patient reports that she tripped and fell over her feet and fell down 4 carpeted steps. She denies loss of consciousness. She's not on any anticoagulants. Initially she was complaining of neck pain. Also noted to have abrasion to the left knee. Vital signs were stable in route. She denies any syncope, chest pain, shortness of breath or any other recent illness.  Past Medical History  Diagnosis Date  . Asthma    History reviewed. No pertinent past surgical history. No family history on file. History  Substance Use Topics  . Smoking status: Never Smoker   . Smokeless tobacco: Not on file  . Alcohol Use: No   OB History   Grav Para Term Preterm Abortions TAB SAB Ect Mult Living                 Review of Systems  Constitutional: Negative for fever.  Respiratory: Negative for chest tightness and shortness of breath.   Cardiovascular: Negative for chest pain.  Gastrointestinal: Negative for nausea, vomiting and abdominal pain.  Genitourinary: Negative for dysuria.  Musculoskeletal: Positive for neck pain. Negative for back pain.       Left knee pain  Skin: Negative for wound.  Neurological: Negative for headaches.  Psychiatric/Behavioral: Negative for confusion.  All other systems reviewed and are negative.     Allergies  Review of patient's allergies indicates no known allergies.  Home Medications   Prior to Admission medications   Medication Sig Start Date End Date Taking? Authorizing Provider  allopurinol (ZYLOPRIM) 300 MG tablet Take 300 mg by mouth daily.   Yes  Historical Provider, MD  Ascorbic Acid (VITAMIN C PO) Take 1 tablet by mouth daily.   Yes Historical Provider, MD  benzonatate (TESSALON) 200 MG capsule Take 200 mg by mouth daily as needed for cough.   Yes Historical Provider, MD  budesonide (PULMICORT) 180 MCG/ACT inhaler Inhale 2 puffs into the lungs daily.   Yes Historical Provider, MD  CALCIUM PO Take 1 tablet by mouth daily.   Yes Historical Provider, MD  Cholecalciferol (VITAMIN D PO) Take 1 tablet by mouth daily.   Yes Historical Provider, MD  famotidine (PEPCID) 10 MG tablet Take 10 mg by mouth daily.   Yes Historical Provider, MD  predniSONE (DELTASONE) 5 MG tablet Take 5 mg by mouth daily with breakfast.   Yes Historical Provider, MD  albuterol (PROVENTIL HFA;VENTOLIN HFA) 108 (90 BASE) MCG/ACT inhaler Inhale 2 puffs into the lungs every 6 (six) hours as needed for wheezing or shortness of breath.    Historical Provider, MD  HYDROcodone-acetaminophen (NORCO/VICODIN) 5-325 MG per tablet Take 1 tablet by mouth every 6 (six) hours as needed for moderate pain or severe pain. 09/28/13   Shon Baton, MD  loperamide (IMODIUM) 2 MG capsule Take 2 capsules (4 mg total) by mouth as needed for diarrhea or loose stools. 05/15/13   Shon Baton, MD  Multiple Vitamin (MULTIVITAMIN WITH MINERALS) TABS tablet Take 1 tablet by mouth daily.    Historical Provider, MD   BP 147/72  Pulse 86  Temp(Src) 98 F (36.7 C) (  Oral)  Resp 16  SpO2 100% Physical Exam  Nursing note and vitals reviewed. Constitutional: No distress.  Elderly  HENT:  Head: Normocephalic and atraumatic.  Right Ear: External ear normal.  Left Ear: External ear normal.  Mouth/Throat: Oropharynx is clear and moist.  Eyes: Pupils are equal, round, and reactive to light.  Neck: Normal range of motion. Neck supple.  No tenderness to palpation of the midline  Cardiovascular: Normal rate, regular rhythm and normal heart sounds.   No murmur heard. Pulmonary/Chest: Effort  normal and breath sounds normal. No respiratory distress. She has no wheezes.  Abdominal: Soft. There is no tenderness.  Musculoskeletal:  Normal range of motion of the bilateral hips and knees, no obvious deformity, extensor mechanism of the left knee intact  Neurological: She is alert.  Moves all 4 extremities  Skin: Skin is warm and dry.  Abrasion to the left knee  Psychiatric: She has a normal mood and affect.    ED Course  Procedures (including critical care time) Labs Review Labs Reviewed  URINALYSIS, ROUTINE W REFLEX MICROSCOPIC    Imaging Review Ct Head Wo Contrast  09/28/2013   CLINICAL DATA:  Fall.  EXAM: CT HEAD WITHOUT CONTRAST  CT CERVICAL SPINE WITHOUT CONTRAST  TECHNIQUE: Multidetector CT imaging of the head and cervical spine was performed following the standard protocol without intravenous contrast. Multiplanar CT image reconstructions of the cervical spine were also generated.  COMPARISON:  None.  FINDINGS: CT HEAD FINDINGS  Cochlear implant noted. Metallic artifact present. No intra-axial or extra-axial pathologic fluid or blood collections. No mass. No hydrocephalus. No acute bony abnormality identified. Visualized paranasal sinuses are clear.  CT CERVICAL SPINE FINDINGS  Prominent calcification of the inferior aspect of the thyroid. The thyroid gland appears multinodular. Thyroid ultrasound suggested for further evaluation. Carotid atherosclerotic vascular disease present. Apical pleural parenchymal thickening again noted most likely related to scarring. Diffuse severe degenerative change. No fracture or dislocation.  IMPRESSION: 1. No acute intracranial abnormality. No evidence of cervical spine fracture. Severe degenerative changes cervical spine. 2. Multinodular thyroid gland with coarse calcification left gland. Thyroid ultrasound can be obtained for further evaluation. 3. Carotid vascular disease. 4. Apical pleural parenchymal thickening and most consistent with  scarring.   Electronically Signed   By: Maisie Fushomas  Register   On: 09/28/2013 11:14   Ct Cervical Spine Wo Contrast  09/28/2013   CLINICAL DATA:  Fall.  EXAM: CT HEAD WITHOUT CONTRAST  CT CERVICAL SPINE WITHOUT CONTRAST  TECHNIQUE: Multidetector CT imaging of the head and cervical spine was performed following the standard protocol without intravenous contrast. Multiplanar CT image reconstructions of the cervical spine were also generated.  COMPARISON:  None.  FINDINGS: CT HEAD FINDINGS  Cochlear implant noted. Metallic artifact present. No intra-axial or extra-axial pathologic fluid or blood collections. No mass. No hydrocephalus. No acute bony abnormality identified. Visualized paranasal sinuses are clear.  CT CERVICAL SPINE FINDINGS  Prominent calcification of the inferior aspect of the thyroid. The thyroid gland appears multinodular. Thyroid ultrasound suggested for further evaluation. Carotid atherosclerotic vascular disease present. Apical pleural parenchymal thickening again noted most likely related to scarring. Diffuse severe degenerative change. No fracture or dislocation.  IMPRESSION: 1. No acute intracranial abnormality. No evidence of cervical spine fracture. Severe degenerative changes cervical spine. 2. Multinodular thyroid gland with coarse calcification left gland. Thyroid ultrasound can be obtained for further evaluation. 3. Carotid vascular disease. 4. Apical pleural parenchymal thickening and most consistent with scarring.   Electronically Signed  ByMaisie Fus  Register   On: 09/28/2013 11:14   Dg Knee Complete 4 Views Left  09/28/2013   CLINICAL DATA:  History of trauma from a fall complaining of knee pain.  EXAM: LEFT KNEE - COMPLETE 4+ VIEW  COMPARISON:  No priors.  FINDINGS: Four views of the left knee demonstrate a well-defined lucency through an enthesophyte extending off the superior aspect of the patella. There is no other acute displaced fracture, subluxation or dislocation. There is  joint space narrowing, subchondral sclerosis, subchondral cyst formation and osteophyte formation, most severe in the medial and patellofemoral compartments, compatible with moderate to severe osteoarthritis. Faint calcifications in the region of the menisci are also noted, compatible with chondrocalcinosis.  IMPRESSION: 1. Well-defined lucency through the enthesophyte extending off the superior aspect of the patella may represent a nondisplaced fracture. No other signs of significant acute traumatic injury to the knee are noted at this time. 2. Moderate to severe tricompartmental osteoarthritis, most pronounced in the medial and patellofemoral compartments. 3. Chondrocalcinosis.   Electronically Signed   By: Trudie Reed M.D.   On: 09/28/2013 10:44     EKG Interpretation None      MDM   Final diagnoses:  Fall, initial encounter  Patellar fracture, left, closed, initial encounter    Patient presents following a reported mechanical fall. She is nontoxic on exam. Only obvious sign of trauma is abrasion to the left knee. Patient was also reported neck pain prior to arrival.  Given age and mechanism, will obtain CT head and neck. Left knee film also obtained. CT head and neck within normal limits. Left knee film concerning for nondisplaced patellar fracture. Extensor mechanism is intact. Patient was placed in a knee immobilizer. She was able to ambulate safely without difficulty with knee immobilizer in place. She will followup closely with orthopedics, Dr. Roda Shutters.  After history, exam, and medical workup I feel the patient has been appropriately medically screened and is safe for discharge home. Pertinent diagnoses were discussed with the patient. Patient was given return precautions.     Shon Baton, MD 09/28/13 1320

## 2013-09-28 NOTE — ED Notes (Signed)
Per EMS- [facility]. Tripped on her foot. Has hx of dementia. No LOC, no blood thinners. Initially reported right neck is sore. Has skin tear to left hand, abrasion to left knee. Denies pain at current except from c-collar. BP 140/90, HR 80.

## 2013-09-28 NOTE — Discharge Instructions (Signed)
Fall Prevention and Home Safety Falls cause injuries and can affect all age groups. It is possible to use preventive measures to significantly decrease the likelihood of falls. There are many simple measures which can make your home safer and prevent falls. OUTDOORS  Repair cracks and edges of walkways and driveways.  Remove high doorway thresholds.  Trim shrubbery on the main path into your home.  Have good outside lighting.  Clear walkways of tools, rocks, debris, and clutter.  Check that handrails are not broken and are securely fastened. Both sides of steps should have handrails.  Have leaves, snow, and ice cleared regularly.  Use sand or salt on walkways during winter months.  In the garage, clean up grease or oil spills. BATHROOM  Install night lights.  Install grab bars by the toilet and in the tub and shower.  Use non-skid mats or decals in the tub or shower.  Place a plastic non-slip stool in the shower to sit on, if needed.  Keep floors dry and clean up all water on the floor immediately.  Remove soap buildup in the tub or shower on a regular basis.  Secure bath mats with non-slip, double-sided rug tape.  Remove throw rugs and tripping hazards from the floors. BEDROOMS  Install night lights.  Make sure a bedside light is easy to reach.  Do not use oversized bedding.  Keep a telephone by your bedside.  Have a firm chair with side arms to use for getting dressed.  Remove throw rugs and tripping hazards from the floor. KITCHEN  Keep handles on pots and pans turned toward the center of the stove. Use back burners when possible.  Clean up spills quickly and allow time for drying.  Avoid walking on wet floors.  Avoid hot utensils and knives.  Position shelves so they are not too high or low.  Place commonly used objects within easy reach.  If necessary, use a sturdy step stool with a grab bar when reaching.  Keep electrical cables out of the  way.  Do not use floor polish or wax that makes floors slippery. If you must use wax, use non-skid floor wax.  Remove throw rugs and tripping hazards from the floor. STAIRWAYS  Never leave objects on stairs.  Place handrails on both sides of stairways and use them. Fix any loose handrails. Make sure handrails on both sides of the stairways are as long as the stairs.  Check carpeting to make sure it is firmly attached along stairs. Make repairs to worn or loose carpet promptly.  Avoid placing throw rugs at the top or bottom of stairways, or properly secure the rug with carpet tape to prevent slippage. Get rid of throw rugs, if possible.  Have an electrician put in a light switch at the top and bottom of the stairs. OTHER FALL PREVENTION TIPS  Wear low-heel or rubber-soled shoes that are supportive and fit well. Wear closed toe shoes.  When using a stepladder, make sure it is fully opened and both spreaders are firmly locked. Do not climb a closed stepladder.  Add color or contrast paint or tape to grab bars and handrails in your home. Place contrasting color strips on first and last steps.  Learn and use mobility aids as needed. Install an electrical emergency response system.  Turn on lights to avoid dark areas. Replace light bulbs that burn out immediately. Get light switches that glow.  Arrange furniture to create clear pathways. Keep furniture in the same place.  Firmly attach carpet with non-skid or double-sided tape.  Eliminate uneven floor surfaces.  Select a carpet pattern that does not visually hide the edge of steps.  Be aware of all pets. OTHER HOME SAFETY TIPS  Set the water temperature for 120 F (48.8 C).  Keep emergency numbers on or near the telephone.  Keep smoke detectors on every level of the home and near sleeping areas. Document Released: 02/06/2002 Document Revised: 08/18/2011 Document Reviewed: 05/08/2011 [facility] Patient Information 2015  [place], [place]. This information is not intended to replace advice given to you by your health care provider. Make sure you discuss any questions you have with your health care provider.  Patellar Fracture, Adult A patellar fracture is a break in your kneecap (patella).  CAUSES   A direct blow to the knee or a fall is usually the cause of a broken patella.  A very hard and strong bending of your knee can cause a patellar fracture. RISK FACTORS Involvement in contact sports, especially sports that involve a lot of jumping. SIGNS AND SYMPTOMS   Tender and swollen knee.  Pain when you move your knee, especially when you try to straighten out your leg.  Difficulty walking or putting weight on your knee.  Misshapen knee (as if a bone is out of place). DIAGNOSIS  Patellar fracture is usually diagnosed with a physical exam and an X-ray exam. TREATMENT  Treatment depends on the type of fracture:  If your patella is still in the right position after the fracture and you can still straighten your leg out, you can usually be treated with a splint or cast for 4-6 weeks.  If your patella is broken into multiple small pieces but you are able to straighten your leg, you can usually be treated with a splint or cast for 4-6 weeks. Sometimes your patella may need to be removed before the cast is applied.  If you cannot straighten out your leg after a patellar fracture, then surgery is required to hold the bony fragments together until they heal. A cast or splint will be applied for 4-6 weeks. HOME CARE INSTRUCTIONS   Only take over-the-counter or prescription medicines for pain, discomfort, or fever as directed by your health care provider.  Use crutches as directed, and exercise the leg as directed.  Apply ice to the injured area:  Put ice in a plastic bag.  Place a towel between your skin and the bag.  Leave the ice on for 20 minutes, 2-3 times a day.  Elevate the affected knee above the  level of your heart. SEEK MEDICAL CARE IF:  You suspect you have significantly injured your knee.  You hear a pop after a knee injury.  Your knee is misshapen after a knee injury.  You have pain when you move your knee.  You have difficulty walking or putting weight on your knee.  You cannot fully move your knee. SEEK IMMEDIATE MEDICAL CARE IF:  You have redness, swelling, or increasing pain in your knee.  You have a fever. Document Released: 11/15/2002 Document Revised: 12/07/2012 Document Reviewed: 09/28/2012 [facility] Patient Information 2015 [place], [place]. This information is not intended to replace advice given to you by your health care provider. Make sure you discuss any questions you have with your health care provider.

## 2013-09-28 NOTE — ED Notes (Signed)
Pt was placed on bedpan; was not enough urine to send for an urine specimen; Horton, MD made aware

## 2013-09-28 NOTE — Progress Notes (Signed)
CM consulted bu Melanie RN to assist with transportation back to the facility. Melanie RN stated that she called the son and he stated that he is in [place] and that the patient has no other family locally. The patient also stated that she has just recently moved to the area and does not know anyone that can transport [facility]. This CM called the facility RN supervisor, Durward ParcelAnnie Miller, and discussed the transportation concern. Pattricia Bossnnie stated that the facility has a transportation service and she would communicate this need to [place]. This CM received a return phone call from [place] and he stated that he will notify the facility transportation services to pick the patient [facility] in the next hour. The transportation arrangement was communicated to the staff RN, Shawna OrleansMelanie. No other questions or concerns at this time. Ferdinand CavaAndrea Schettino, RN, BSN, Case Manager 09/28/2013 2:32 PM

## 2013-10-02 DIAGNOSIS — H902 Conductive hearing loss, unspecified: Secondary | ICD-10-CM | POA: Diagnosis not present

## 2013-10-02 DIAGNOSIS — R0602 Shortness of breath: Secondary | ICD-10-CM | POA: Diagnosis not present

## 2013-10-02 DIAGNOSIS — R197 Diarrhea, unspecified: Secondary | ICD-10-CM | POA: Diagnosis not present

## 2013-10-02 DIAGNOSIS — K219 Gastro-esophageal reflux disease without esophagitis: Secondary | ICD-10-CM | POA: Diagnosis not present

## 2013-10-02 DIAGNOSIS — J42 Unspecified chronic bronchitis: Secondary | ICD-10-CM | POA: Diagnosis not present

## 2013-10-02 DIAGNOSIS — S82009A Unspecified fracture of unspecified patella, initial encounter for closed fracture: Secondary | ICD-10-CM | POA: Diagnosis not present

## 2013-10-02 DIAGNOSIS — N390 Urinary tract infection, site not specified: Secondary | ICD-10-CM | POA: Diagnosis not present

## 2013-10-02 DIAGNOSIS — G8911 Acute pain due to trauma: Secondary | ICD-10-CM | POA: Diagnosis not present

## 2013-10-02 DIAGNOSIS — E569 Vitamin deficiency, unspecified: Secondary | ICD-10-CM | POA: Diagnosis not present

## 2013-10-02 DIAGNOSIS — M109 Gout, unspecified: Secondary | ICD-10-CM | POA: Diagnosis not present

## 2013-10-02 DIAGNOSIS — E559 Vitamin D deficiency, unspecified: Secondary | ICD-10-CM | POA: Diagnosis not present

## 2013-10-02 DIAGNOSIS — R059 Cough, unspecified: Secondary | ICD-10-CM | POA: Diagnosis not present

## 2013-10-02 DIAGNOSIS — E509 Vitamin A deficiency, unspecified: Secondary | ICD-10-CM | POA: Diagnosis not present

## 2013-10-02 DIAGNOSIS — R05 Cough: Secondary | ICD-10-CM | POA: Diagnosis not present

## 2013-10-03 DIAGNOSIS — E509 Vitamin A deficiency, unspecified: Secondary | ICD-10-CM | POA: Diagnosis not present

## 2013-10-03 DIAGNOSIS — H902 Conductive hearing loss, unspecified: Secondary | ICD-10-CM | POA: Diagnosis not present

## 2013-10-03 DIAGNOSIS — S82009A Unspecified fracture of unspecified patella, initial encounter for closed fracture: Secondary | ICD-10-CM | POA: Diagnosis not present

## 2013-10-03 DIAGNOSIS — M109 Gout, unspecified: Secondary | ICD-10-CM | POA: Diagnosis not present

## 2013-10-03 DIAGNOSIS — K219 Gastro-esophageal reflux disease without esophagitis: Secondary | ICD-10-CM | POA: Diagnosis not present

## 2013-10-03 DIAGNOSIS — J42 Unspecified chronic bronchitis: Secondary | ICD-10-CM | POA: Diagnosis not present

## 2013-10-04 DIAGNOSIS — H902 Conductive hearing loss, unspecified: Secondary | ICD-10-CM | POA: Diagnosis not present

## 2013-10-04 DIAGNOSIS — K219 Gastro-esophageal reflux disease without esophagitis: Secondary | ICD-10-CM | POA: Diagnosis not present

## 2013-10-04 DIAGNOSIS — S82009A Unspecified fracture of unspecified patella, initial encounter for closed fracture: Secondary | ICD-10-CM | POA: Diagnosis not present

## 2013-10-04 DIAGNOSIS — M109 Gout, unspecified: Secondary | ICD-10-CM | POA: Diagnosis not present

## 2013-10-04 DIAGNOSIS — J42 Unspecified chronic bronchitis: Secondary | ICD-10-CM | POA: Diagnosis not present

## 2013-10-04 DIAGNOSIS — E509 Vitamin A deficiency, unspecified: Secondary | ICD-10-CM | POA: Diagnosis not present

## 2013-10-24 DIAGNOSIS — R262 Difficulty in walking, not elsewhere classified: Secondary | ICD-10-CM | POA: Diagnosis not present

## 2013-10-24 DIAGNOSIS — M6281 Muscle weakness (generalized): Secondary | ICD-10-CM | POA: Diagnosis not present

## 2013-10-24 DIAGNOSIS — S82009A Unspecified fracture of unspecified patella, initial encounter for closed fracture: Secondary | ICD-10-CM | POA: Diagnosis not present

## 2013-10-24 DIAGNOSIS — R488 Other symbolic dysfunctions: Secondary | ICD-10-CM | POA: Diagnosis not present

## 2013-11-01 DIAGNOSIS — M6281 Muscle weakness (generalized): Secondary | ICD-10-CM | POA: Diagnosis not present

## 2013-11-01 DIAGNOSIS — S82009A Unspecified fracture of unspecified patella, initial encounter for closed fracture: Secondary | ICD-10-CM | POA: Diagnosis not present

## 2013-11-01 DIAGNOSIS — R488 Other symbolic dysfunctions: Secondary | ICD-10-CM | POA: Diagnosis not present

## 2013-11-01 DIAGNOSIS — R262 Difficulty in walking, not elsewhere classified: Secondary | ICD-10-CM | POA: Diagnosis not present

## 2013-11-02 DIAGNOSIS — L659 Nonscarring hair loss, unspecified: Secondary | ICD-10-CM | POA: Diagnosis not present

## 2013-11-03 DIAGNOSIS — H903 Sensorineural hearing loss, bilateral: Secondary | ICD-10-CM | POA: Diagnosis not present

## 2013-11-06 DIAGNOSIS — R488 Other symbolic dysfunctions: Secondary | ICD-10-CM | POA: Diagnosis not present

## 2013-11-06 DIAGNOSIS — R262 Difficulty in walking, not elsewhere classified: Secondary | ICD-10-CM | POA: Diagnosis not present

## 2013-11-06 DIAGNOSIS — M6281 Muscle weakness (generalized): Secondary | ICD-10-CM | POA: Diagnosis not present

## 2013-11-06 DIAGNOSIS — S82009A Unspecified fracture of unspecified patella, initial encounter for closed fracture: Secondary | ICD-10-CM | POA: Diagnosis not present

## 2013-11-08 DIAGNOSIS — M6281 Muscle weakness (generalized): Secondary | ICD-10-CM | POA: Diagnosis not present

## 2013-11-08 DIAGNOSIS — S82009A Unspecified fracture of unspecified patella, initial encounter for closed fracture: Secondary | ICD-10-CM | POA: Diagnosis not present

## 2013-11-08 DIAGNOSIS — R488 Other symbolic dysfunctions: Secondary | ICD-10-CM | POA: Diagnosis not present

## 2013-11-08 DIAGNOSIS — R262 Difficulty in walking, not elsewhere classified: Secondary | ICD-10-CM | POA: Diagnosis not present

## 2013-11-10 DIAGNOSIS — S82009A Unspecified fracture of unspecified patella, initial encounter for closed fracture: Secondary | ICD-10-CM | POA: Diagnosis not present

## 2013-11-10 DIAGNOSIS — R262 Difficulty in walking, not elsewhere classified: Secondary | ICD-10-CM | POA: Diagnosis not present

## 2013-11-10 DIAGNOSIS — M6281 Muscle weakness (generalized): Secondary | ICD-10-CM | POA: Diagnosis not present

## 2013-11-10 DIAGNOSIS — R488 Other symbolic dysfunctions: Secondary | ICD-10-CM | POA: Diagnosis not present

## 2013-11-17 DIAGNOSIS — H903 Sensorineural hearing loss, bilateral: Secondary | ICD-10-CM | POA: Diagnosis not present

## 2013-11-28 DIAGNOSIS — M899 Disorder of bone, unspecified: Secondary | ICD-10-CM | POA: Diagnosis not present

## 2013-11-28 DIAGNOSIS — M949 Disorder of cartilage, unspecified: Secondary | ICD-10-CM | POA: Diagnosis not present

## 2013-12-05 DIAGNOSIS — M8580 Other specified disorders of bone density and structure, unspecified site: Secondary | ICD-10-CM | POA: Diagnosis not present

## 2013-12-05 DIAGNOSIS — G4733 Obstructive sleep apnea (adult) (pediatric): Secondary | ICD-10-CM | POA: Diagnosis not present

## 2013-12-05 DIAGNOSIS — M353 Polymyalgia rheumatica: Secondary | ICD-10-CM | POA: Diagnosis not present

## 2014-01-02 DIAGNOSIS — R197 Diarrhea, unspecified: Secondary | ICD-10-CM | POA: Diagnosis not present

## 2014-01-04 DIAGNOSIS — R197 Diarrhea, unspecified: Secondary | ICD-10-CM | POA: Diagnosis not present

## 2014-01-23 ENCOUNTER — Other Ambulatory Visit: Payer: Self-pay | Admitting: Nurse Practitioner

## 2014-01-23 ENCOUNTER — Ambulatory Visit
Admission: RE | Admit: 2014-01-23 | Discharge: 2014-01-23 | Disposition: A | Discharge status: Home / Self Care | Payer: Medicare Other | Source: Ambulatory Visit | Attending: Nurse Practitioner | Admitting: Nurse Practitioner

## 2014-01-23 DIAGNOSIS — J441 Chronic obstructive pulmonary disease with (acute) exacerbation: Secondary | ICD-10-CM

## 2014-01-23 DIAGNOSIS — R918 Other nonspecific abnormal finding of lung field: Secondary | ICD-10-CM | POA: Diagnosis not present

## 2014-03-13 DIAGNOSIS — M353 Polymyalgia rheumatica: Secondary | ICD-10-CM | POA: Diagnosis not present

## 2014-03-13 DIAGNOSIS — R05 Cough: Secondary | ICD-10-CM | POA: Diagnosis not present

## 2014-03-13 DIAGNOSIS — M25559 Pain in unspecified hip: Secondary | ICD-10-CM | POA: Diagnosis not present

## 2014-04-13 DIAGNOSIS — R350 Frequency of micturition: Secondary | ICD-10-CM | POA: Diagnosis not present

## 2014-04-13 DIAGNOSIS — R413 Other amnesia: Secondary | ICD-10-CM | POA: Diagnosis not present

## 2014-05-09 DIAGNOSIS — Z961 Presence of intraocular lens: Secondary | ICD-10-CM | POA: Diagnosis not present

## 2014-05-28 DIAGNOSIS — R413 Other amnesia: Secondary | ICD-10-CM | POA: Diagnosis not present

## 2014-05-28 DIAGNOSIS — R300 Dysuria: Secondary | ICD-10-CM | POA: Diagnosis not present

## 2014-06-15 DIAGNOSIS — R413 Other amnesia: Secondary | ICD-10-CM | POA: Diagnosis not present

## 2014-06-15 DIAGNOSIS — K219 Gastro-esophageal reflux disease without esophagitis: Secondary | ICD-10-CM | POA: Diagnosis not present

## 2014-06-19 DIAGNOSIS — R488 Other symbolic dysfunctions: Secondary | ICD-10-CM | POA: Diagnosis not present

## 2014-06-19 DIAGNOSIS — R4182 Altered mental status, unspecified: Secondary | ICD-10-CM | POA: Diagnosis not present

## 2014-06-19 DIAGNOSIS — Z79899 Other long term (current) drug therapy: Secondary | ICD-10-CM | POA: Diagnosis not present

## 2014-06-20 DIAGNOSIS — R488 Other symbolic dysfunctions: Secondary | ICD-10-CM | POA: Diagnosis not present

## 2014-06-20 DIAGNOSIS — R4182 Altered mental status, unspecified: Secondary | ICD-10-CM | POA: Diagnosis not present

## 2014-06-21 DIAGNOSIS — R488 Other symbolic dysfunctions: Secondary | ICD-10-CM | POA: Diagnosis not present

## 2014-06-21 DIAGNOSIS — R4182 Altered mental status, unspecified: Secondary | ICD-10-CM | POA: Diagnosis not present

## 2014-06-22 DIAGNOSIS — R488 Other symbolic dysfunctions: Secondary | ICD-10-CM | POA: Diagnosis not present

## 2014-06-22 DIAGNOSIS — R4182 Altered mental status, unspecified: Secondary | ICD-10-CM | POA: Diagnosis not present

## 2014-06-23 IMAGING — CR DG CHEST 1V PORT
1 series · 1 of 1 positions shown · non-contrast
Comparison: None.

CLINICAL DATA: Cough, shortness of breath, weakness

EXAM:
PORTABLE CHEST - 1 VIEW

[AP]
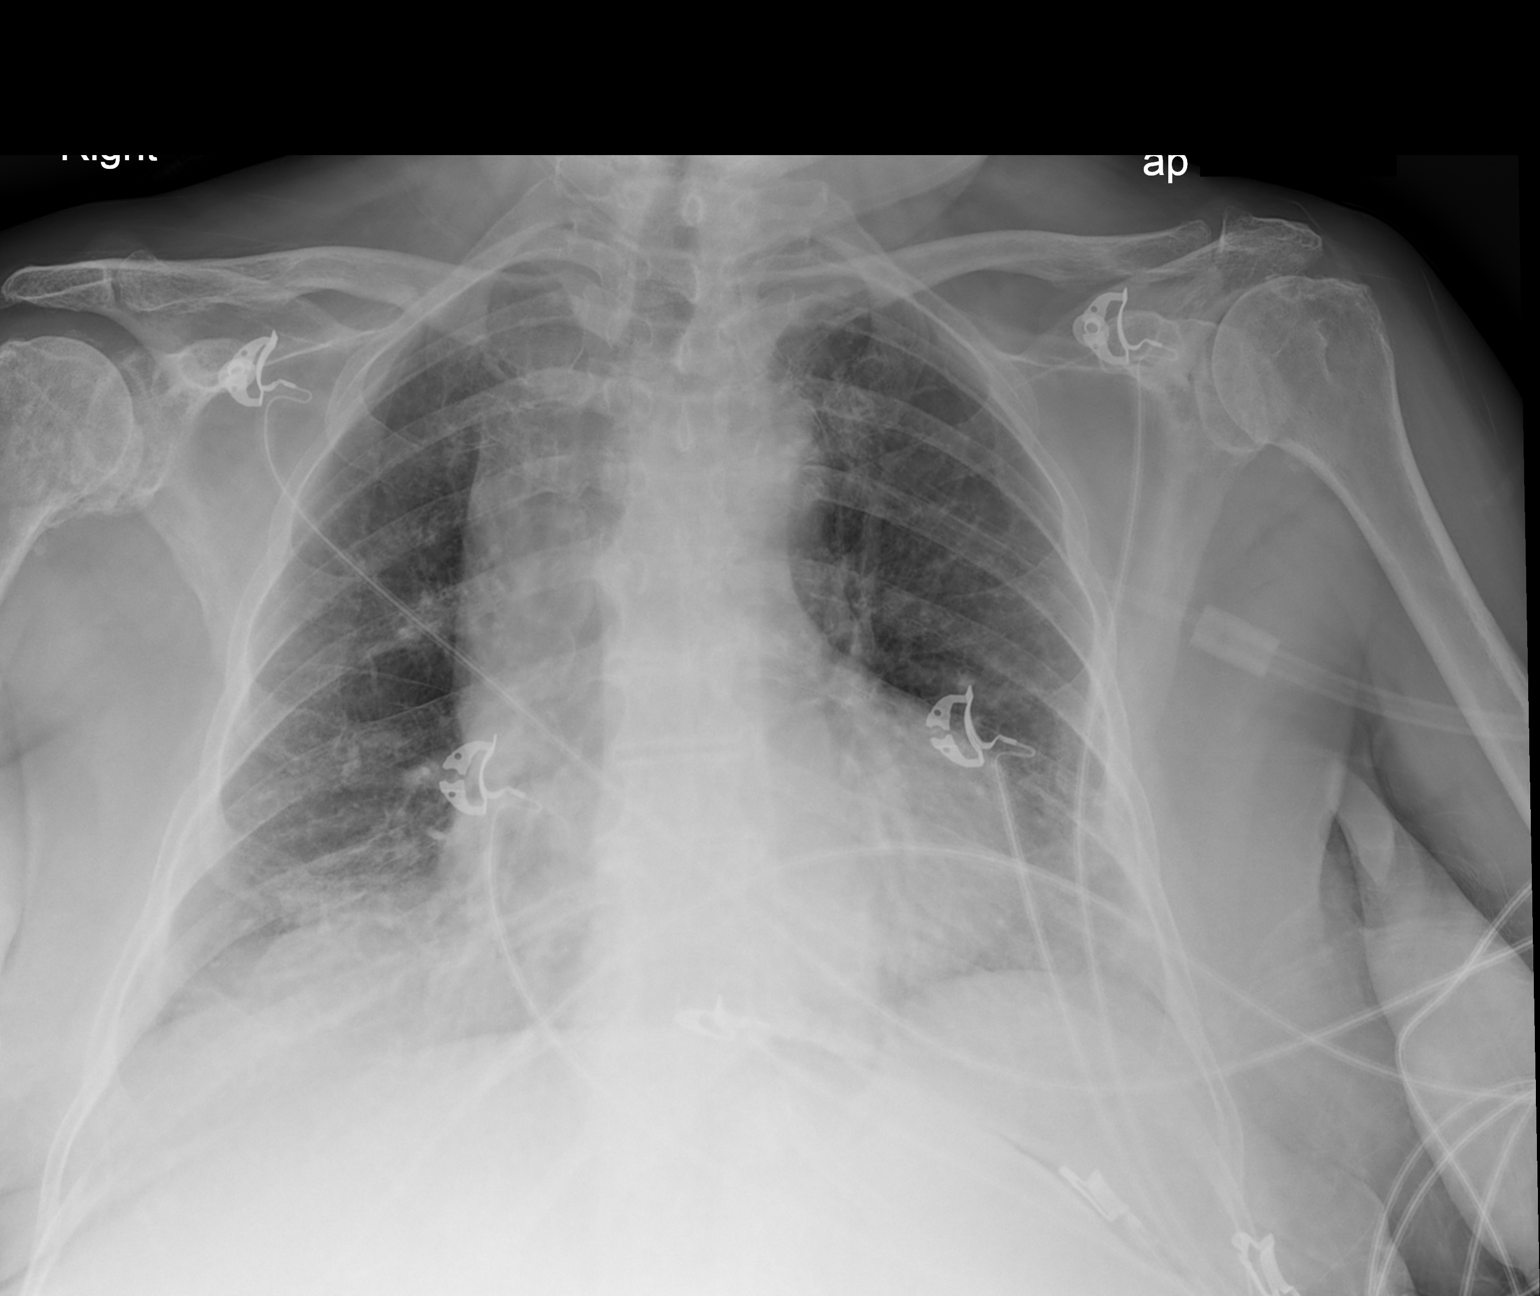

[1 of 1 positions shown; findings below may reference images not displayed]

FINDINGS: The heart size and mediastinal contours are within normal limits.
Both lungs are clear. The visualized skeletal structures are
unremarkable. Lungs are hypoaerated with crowding of the
bronchovascular markings. The aorta is unfolded and ectatic. This
likely accounts for prominence of the right peritracheal stripe
along with rotation to the right. Right glenohumeral joint
degenerative change noted.
IMPRESSION: Cardiomegaly without focal acute finding. Lungs are hypoaerated with
crowding of the bronchovascular markings. If symptoms persist,
consider PA and lateral chest radiographs obtained at full
inspiration when the patient is clinically able.

## 2014-06-25 DIAGNOSIS — R4182 Altered mental status, unspecified: Secondary | ICD-10-CM | POA: Diagnosis not present

## 2014-06-25 DIAGNOSIS — R488 Other symbolic dysfunctions: Secondary | ICD-10-CM | POA: Diagnosis not present

## 2014-06-26 DIAGNOSIS — R4182 Altered mental status, unspecified: Secondary | ICD-10-CM | POA: Diagnosis not present

## 2014-06-26 DIAGNOSIS — R488 Other symbolic dysfunctions: Secondary | ICD-10-CM | POA: Diagnosis not present

## 2014-06-27 DIAGNOSIS — R4182 Altered mental status, unspecified: Secondary | ICD-10-CM | POA: Diagnosis not present

## 2014-06-27 DIAGNOSIS — R488 Other symbolic dysfunctions: Secondary | ICD-10-CM | POA: Diagnosis not present

## 2014-06-28 DIAGNOSIS — R488 Other symbolic dysfunctions: Secondary | ICD-10-CM | POA: Diagnosis not present

## 2014-06-28 DIAGNOSIS — R4182 Altered mental status, unspecified: Secondary | ICD-10-CM | POA: Diagnosis not present

## 2014-06-29 DIAGNOSIS — H903 Sensorineural hearing loss, bilateral: Secondary | ICD-10-CM | POA: Diagnosis not present

## 2014-07-02 DIAGNOSIS — R488 Other symbolic dysfunctions: Secondary | ICD-10-CM | POA: Diagnosis not present

## 2014-07-02 DIAGNOSIS — R4182 Altered mental status, unspecified: Secondary | ICD-10-CM | POA: Diagnosis not present

## 2014-07-03 DIAGNOSIS — H903 Sensorineural hearing loss, bilateral: Secondary | ICD-10-CM | POA: Diagnosis not present

## 2014-07-03 DIAGNOSIS — R4182 Altered mental status, unspecified: Secondary | ICD-10-CM | POA: Diagnosis not present

## 2014-07-03 DIAGNOSIS — R488 Other symbolic dysfunctions: Secondary | ICD-10-CM | POA: Diagnosis not present

## 2014-07-04 DIAGNOSIS — R488 Other symbolic dysfunctions: Secondary | ICD-10-CM | POA: Diagnosis not present

## 2014-07-04 DIAGNOSIS — J45909 Unspecified asthma, uncomplicated: Secondary | ICD-10-CM | POA: Diagnosis not present

## 2014-07-04 DIAGNOSIS — R4182 Altered mental status, unspecified: Secondary | ICD-10-CM | POA: Diagnosis not present

## 2014-07-04 DIAGNOSIS — G301 Alzheimer's disease with late onset: Secondary | ICD-10-CM | POA: Diagnosis not present

## 2014-07-05 DIAGNOSIS — R488 Other symbolic dysfunctions: Secondary | ICD-10-CM | POA: Diagnosis not present

## 2014-07-05 DIAGNOSIS — R4182 Altered mental status, unspecified: Secondary | ICD-10-CM | POA: Diagnosis not present

## 2014-07-06 DIAGNOSIS — R488 Other symbolic dysfunctions: Secondary | ICD-10-CM | POA: Diagnosis not present

## 2014-07-06 DIAGNOSIS — R4182 Altered mental status, unspecified: Secondary | ICD-10-CM | POA: Diagnosis not present

## 2014-07-09 DIAGNOSIS — R4182 Altered mental status, unspecified: Secondary | ICD-10-CM | POA: Diagnosis not present

## 2014-07-09 DIAGNOSIS — R488 Other symbolic dysfunctions: Secondary | ICD-10-CM | POA: Diagnosis not present

## 2014-07-10 DIAGNOSIS — R4182 Altered mental status, unspecified: Secondary | ICD-10-CM | POA: Diagnosis not present

## 2014-07-10 DIAGNOSIS — R488 Other symbolic dysfunctions: Secondary | ICD-10-CM | POA: Diagnosis not present

## 2014-07-11 DIAGNOSIS — R488 Other symbolic dysfunctions: Secondary | ICD-10-CM | POA: Diagnosis not present

## 2014-07-11 DIAGNOSIS — R4182 Altered mental status, unspecified: Secondary | ICD-10-CM | POA: Diagnosis not present

## 2014-07-12 DIAGNOSIS — R4182 Altered mental status, unspecified: Secondary | ICD-10-CM | POA: Diagnosis not present

## 2014-07-12 DIAGNOSIS — R488 Other symbolic dysfunctions: Secondary | ICD-10-CM | POA: Diagnosis not present

## 2014-07-30 DIAGNOSIS — N390 Urinary tract infection, site not specified: Secondary | ICD-10-CM | POA: Diagnosis not present

## 2014-08-07 DIAGNOSIS — J4540 Moderate persistent asthma, uncomplicated: Secondary | ICD-10-CM | POA: Diagnosis not present

## 2014-08-07 DIAGNOSIS — G301 Alzheimer's disease with late onset: Secondary | ICD-10-CM | POA: Diagnosis not present

## 2014-08-23 DIAGNOSIS — R05 Cough: Secondary | ICD-10-CM | POA: Diagnosis not present

## 2014-08-27 DIAGNOSIS — J4541 Moderate persistent asthma with (acute) exacerbation: Secondary | ICD-10-CM | POA: Diagnosis not present

## 2014-09-11 DIAGNOSIS — M6281 Muscle weakness (generalized): Secondary | ICD-10-CM | POA: Diagnosis not present

## 2014-09-11 DIAGNOSIS — R488 Other symbolic dysfunctions: Secondary | ICD-10-CM | POA: Diagnosis not present

## 2014-09-11 DIAGNOSIS — J069 Acute upper respiratory infection, unspecified: Secondary | ICD-10-CM | POA: Diagnosis not present

## 2014-09-11 DIAGNOSIS — K219 Gastro-esophageal reflux disease without esophagitis: Secondary | ICD-10-CM | POA: Diagnosis not present

## 2014-09-11 DIAGNOSIS — R278 Other lack of coordination: Secondary | ICD-10-CM | POA: Diagnosis not present

## 2014-09-11 DIAGNOSIS — H93293 Other abnormal auditory perceptions, bilateral: Secondary | ICD-10-CM | POA: Diagnosis not present

## 2014-09-12 DIAGNOSIS — H93293 Other abnormal auditory perceptions, bilateral: Secondary | ICD-10-CM | POA: Diagnosis not present

## 2014-09-12 DIAGNOSIS — M6281 Muscle weakness (generalized): Secondary | ICD-10-CM | POA: Diagnosis not present

## 2014-09-12 DIAGNOSIS — J069 Acute upper respiratory infection, unspecified: Secondary | ICD-10-CM | POA: Diagnosis not present

## 2014-09-12 DIAGNOSIS — R488 Other symbolic dysfunctions: Secondary | ICD-10-CM | POA: Diagnosis not present

## 2014-09-12 DIAGNOSIS — R278 Other lack of coordination: Secondary | ICD-10-CM | POA: Diagnosis not present

## 2014-09-17 DIAGNOSIS — H93293 Other abnormal auditory perceptions, bilateral: Secondary | ICD-10-CM | POA: Diagnosis not present

## 2014-09-17 DIAGNOSIS — J069 Acute upper respiratory infection, unspecified: Secondary | ICD-10-CM | POA: Diagnosis not present

## 2014-09-17 DIAGNOSIS — R278 Other lack of coordination: Secondary | ICD-10-CM | POA: Diagnosis not present

## 2014-09-17 DIAGNOSIS — R488 Other symbolic dysfunctions: Secondary | ICD-10-CM | POA: Diagnosis not present

## 2014-09-17 DIAGNOSIS — M6281 Muscle weakness (generalized): Secondary | ICD-10-CM | POA: Diagnosis not present

## 2014-09-18 DIAGNOSIS — Z23 Encounter for immunization: Secondary | ICD-10-CM | POA: Diagnosis not present

## 2014-09-18 DIAGNOSIS — M353 Polymyalgia rheumatica: Secondary | ICD-10-CM | POA: Diagnosis not present

## 2014-09-18 DIAGNOSIS — R05 Cough: Secondary | ICD-10-CM | POA: Diagnosis not present

## 2014-09-18 DIAGNOSIS — Z0000 Encounter for general adult medical examination without abnormal findings: Secondary | ICD-10-CM | POA: Diagnosis not present

## 2014-09-18 DIAGNOSIS — R51 Headache: Secondary | ICD-10-CM | POA: Diagnosis not present

## 2014-09-19 DIAGNOSIS — R278 Other lack of coordination: Secondary | ICD-10-CM | POA: Diagnosis not present

## 2014-09-19 DIAGNOSIS — M6281 Muscle weakness (generalized): Secondary | ICD-10-CM | POA: Diagnosis not present

## 2014-09-19 DIAGNOSIS — H93293 Other abnormal auditory perceptions, bilateral: Secondary | ICD-10-CM | POA: Diagnosis not present

## 2014-09-19 DIAGNOSIS — J069 Acute upper respiratory infection, unspecified: Secondary | ICD-10-CM | POA: Diagnosis not present

## 2014-09-19 DIAGNOSIS — R488 Other symbolic dysfunctions: Secondary | ICD-10-CM | POA: Diagnosis not present

## 2014-09-20 DIAGNOSIS — H93293 Other abnormal auditory perceptions, bilateral: Secondary | ICD-10-CM | POA: Diagnosis not present

## 2014-09-20 DIAGNOSIS — R278 Other lack of coordination: Secondary | ICD-10-CM | POA: Diagnosis not present

## 2014-09-20 DIAGNOSIS — J069 Acute upper respiratory infection, unspecified: Secondary | ICD-10-CM | POA: Diagnosis not present

## 2014-09-20 DIAGNOSIS — R488 Other symbolic dysfunctions: Secondary | ICD-10-CM | POA: Diagnosis not present

## 2014-09-20 DIAGNOSIS — M6281 Muscle weakness (generalized): Secondary | ICD-10-CM | POA: Diagnosis not present

## 2014-09-21 DIAGNOSIS — J069 Acute upper respiratory infection, unspecified: Secondary | ICD-10-CM | POA: Diagnosis not present

## 2014-09-21 DIAGNOSIS — M6281 Muscle weakness (generalized): Secondary | ICD-10-CM | POA: Diagnosis not present

## 2014-09-21 DIAGNOSIS — H93293 Other abnormal auditory perceptions, bilateral: Secondary | ICD-10-CM | POA: Diagnosis not present

## 2014-09-21 DIAGNOSIS — R488 Other symbolic dysfunctions: Secondary | ICD-10-CM | POA: Diagnosis not present

## 2014-09-21 DIAGNOSIS — R278 Other lack of coordination: Secondary | ICD-10-CM | POA: Diagnosis not present

## 2014-09-24 DIAGNOSIS — J069 Acute upper respiratory infection, unspecified: Secondary | ICD-10-CM | POA: Diagnosis not present

## 2014-09-24 DIAGNOSIS — H93293 Other abnormal auditory perceptions, bilateral: Secondary | ICD-10-CM | POA: Diagnosis not present

## 2014-09-24 DIAGNOSIS — M6281 Muscle weakness (generalized): Secondary | ICD-10-CM | POA: Diagnosis not present

## 2014-09-24 DIAGNOSIS — R278 Other lack of coordination: Secondary | ICD-10-CM | POA: Diagnosis not present

## 2014-09-24 DIAGNOSIS — R488 Other symbolic dysfunctions: Secondary | ICD-10-CM | POA: Diagnosis not present

## 2014-09-25 DIAGNOSIS — H93293 Other abnormal auditory perceptions, bilateral: Secondary | ICD-10-CM | POA: Diagnosis not present

## 2014-09-25 DIAGNOSIS — R488 Other symbolic dysfunctions: Secondary | ICD-10-CM | POA: Diagnosis not present

## 2014-09-25 DIAGNOSIS — M6281 Muscle weakness (generalized): Secondary | ICD-10-CM | POA: Diagnosis not present

## 2014-09-25 DIAGNOSIS — J069 Acute upper respiratory infection, unspecified: Secondary | ICD-10-CM | POA: Diagnosis not present

## 2014-09-25 DIAGNOSIS — R278 Other lack of coordination: Secondary | ICD-10-CM | POA: Diagnosis not present

## 2014-09-26 DIAGNOSIS — H93293 Other abnormal auditory perceptions, bilateral: Secondary | ICD-10-CM | POA: Diagnosis not present

## 2014-09-26 DIAGNOSIS — R278 Other lack of coordination: Secondary | ICD-10-CM | POA: Diagnosis not present

## 2014-09-26 DIAGNOSIS — R488 Other symbolic dysfunctions: Secondary | ICD-10-CM | POA: Diagnosis not present

## 2014-09-26 DIAGNOSIS — J069 Acute upper respiratory infection, unspecified: Secondary | ICD-10-CM | POA: Diagnosis not present

## 2014-09-26 DIAGNOSIS — M6281 Muscle weakness (generalized): Secondary | ICD-10-CM | POA: Diagnosis not present

## 2014-09-27 DIAGNOSIS — M6281 Muscle weakness (generalized): Secondary | ICD-10-CM | POA: Diagnosis not present

## 2014-09-27 DIAGNOSIS — R278 Other lack of coordination: Secondary | ICD-10-CM | POA: Diagnosis not present

## 2014-09-27 DIAGNOSIS — J069 Acute upper respiratory infection, unspecified: Secondary | ICD-10-CM | POA: Diagnosis not present

## 2014-09-27 DIAGNOSIS — H93293 Other abnormal auditory perceptions, bilateral: Secondary | ICD-10-CM | POA: Diagnosis not present

## 2014-09-27 DIAGNOSIS — R488 Other symbolic dysfunctions: Secondary | ICD-10-CM | POA: Diagnosis not present

## 2014-09-28 DIAGNOSIS — R278 Other lack of coordination: Secondary | ICD-10-CM | POA: Diagnosis not present

## 2014-09-28 DIAGNOSIS — M6281 Muscle weakness (generalized): Secondary | ICD-10-CM | POA: Diagnosis not present

## 2014-09-28 DIAGNOSIS — R488 Other symbolic dysfunctions: Secondary | ICD-10-CM | POA: Diagnosis not present

## 2014-09-28 DIAGNOSIS — J069 Acute upper respiratory infection, unspecified: Secondary | ICD-10-CM | POA: Diagnosis not present

## 2014-09-28 DIAGNOSIS — H93293 Other abnormal auditory perceptions, bilateral: Secondary | ICD-10-CM | POA: Diagnosis not present

## 2014-09-30 DIAGNOSIS — H93293 Other abnormal auditory perceptions, bilateral: Secondary | ICD-10-CM | POA: Diagnosis not present

## 2014-09-30 DIAGNOSIS — R488 Other symbolic dysfunctions: Secondary | ICD-10-CM | POA: Diagnosis not present

## 2014-09-30 DIAGNOSIS — M6281 Muscle weakness (generalized): Secondary | ICD-10-CM | POA: Diagnosis not present

## 2014-09-30 DIAGNOSIS — J069 Acute upper respiratory infection, unspecified: Secondary | ICD-10-CM | POA: Diagnosis not present

## 2014-09-30 DIAGNOSIS — R278 Other lack of coordination: Secondary | ICD-10-CM | POA: Diagnosis not present

## 2014-10-02 DIAGNOSIS — M6281 Muscle weakness (generalized): Secondary | ICD-10-CM | POA: Diagnosis not present

## 2014-10-02 DIAGNOSIS — R278 Other lack of coordination: Secondary | ICD-10-CM | POA: Diagnosis not present

## 2014-10-02 DIAGNOSIS — J069 Acute upper respiratory infection, unspecified: Secondary | ICD-10-CM | POA: Diagnosis not present

## 2014-10-02 DIAGNOSIS — H93293 Other abnormal auditory perceptions, bilateral: Secondary | ICD-10-CM | POA: Diagnosis not present

## 2014-10-02 DIAGNOSIS — R488 Other symbolic dysfunctions: Secondary | ICD-10-CM | POA: Diagnosis not present

## 2014-10-03 DIAGNOSIS — R488 Other symbolic dysfunctions: Secondary | ICD-10-CM | POA: Diagnosis not present

## 2014-10-03 DIAGNOSIS — M6281 Muscle weakness (generalized): Secondary | ICD-10-CM | POA: Diagnosis not present

## 2014-10-03 DIAGNOSIS — R278 Other lack of coordination: Secondary | ICD-10-CM | POA: Diagnosis not present

## 2014-10-03 DIAGNOSIS — H93293 Other abnormal auditory perceptions, bilateral: Secondary | ICD-10-CM | POA: Diagnosis not present

## 2014-10-03 DIAGNOSIS — J069 Acute upper respiratory infection, unspecified: Secondary | ICD-10-CM | POA: Diagnosis not present

## 2014-10-04 DIAGNOSIS — M6281 Muscle weakness (generalized): Secondary | ICD-10-CM | POA: Diagnosis not present

## 2014-10-04 DIAGNOSIS — R278 Other lack of coordination: Secondary | ICD-10-CM | POA: Diagnosis not present

## 2014-10-04 DIAGNOSIS — R488 Other symbolic dysfunctions: Secondary | ICD-10-CM | POA: Diagnosis not present

## 2014-10-04 DIAGNOSIS — H93293 Other abnormal auditory perceptions, bilateral: Secondary | ICD-10-CM | POA: Diagnosis not present

## 2014-10-04 DIAGNOSIS — J069 Acute upper respiratory infection, unspecified: Secondary | ICD-10-CM | POA: Diagnosis not present

## 2014-10-05 DIAGNOSIS — H93293 Other abnormal auditory perceptions, bilateral: Secondary | ICD-10-CM | POA: Diagnosis not present

## 2014-10-05 DIAGNOSIS — J069 Acute upper respiratory infection, unspecified: Secondary | ICD-10-CM | POA: Diagnosis not present

## 2014-10-05 DIAGNOSIS — M6281 Muscle weakness (generalized): Secondary | ICD-10-CM | POA: Diagnosis not present

## 2014-10-05 DIAGNOSIS — R278 Other lack of coordination: Secondary | ICD-10-CM | POA: Diagnosis not present

## 2014-10-05 DIAGNOSIS — R488 Other symbolic dysfunctions: Secondary | ICD-10-CM | POA: Diagnosis not present

## 2014-10-08 DIAGNOSIS — J069 Acute upper respiratory infection, unspecified: Secondary | ICD-10-CM | POA: Diagnosis not present

## 2014-10-08 DIAGNOSIS — H93293 Other abnormal auditory perceptions, bilateral: Secondary | ICD-10-CM | POA: Diagnosis not present

## 2014-10-08 DIAGNOSIS — M6281 Muscle weakness (generalized): Secondary | ICD-10-CM | POA: Diagnosis not present

## 2014-10-08 DIAGNOSIS — R488 Other symbolic dysfunctions: Secondary | ICD-10-CM | POA: Diagnosis not present

## 2014-10-08 DIAGNOSIS — R278 Other lack of coordination: Secondary | ICD-10-CM | POA: Diagnosis not present

## 2014-10-09 DIAGNOSIS — R278 Other lack of coordination: Secondary | ICD-10-CM | POA: Diagnosis not present

## 2014-10-09 DIAGNOSIS — M6281 Muscle weakness (generalized): Secondary | ICD-10-CM | POA: Diagnosis not present

## 2014-10-09 DIAGNOSIS — J069 Acute upper respiratory infection, unspecified: Secondary | ICD-10-CM | POA: Diagnosis not present

## 2014-10-09 DIAGNOSIS — H93293 Other abnormal auditory perceptions, bilateral: Secondary | ICD-10-CM | POA: Diagnosis not present

## 2014-10-09 DIAGNOSIS — R488 Other symbolic dysfunctions: Secondary | ICD-10-CM | POA: Diagnosis not present

## 2014-10-10 DIAGNOSIS — R488 Other symbolic dysfunctions: Secondary | ICD-10-CM | POA: Diagnosis not present

## 2014-10-10 DIAGNOSIS — M6281 Muscle weakness (generalized): Secondary | ICD-10-CM | POA: Diagnosis not present

## 2014-10-10 DIAGNOSIS — R278 Other lack of coordination: Secondary | ICD-10-CM | POA: Diagnosis not present

## 2014-10-10 DIAGNOSIS — J069 Acute upper respiratory infection, unspecified: Secondary | ICD-10-CM | POA: Diagnosis not present

## 2014-10-10 DIAGNOSIS — H93293 Other abnormal auditory perceptions, bilateral: Secondary | ICD-10-CM | POA: Diagnosis not present

## 2014-10-11 DIAGNOSIS — R278 Other lack of coordination: Secondary | ICD-10-CM | POA: Diagnosis not present

## 2014-10-11 DIAGNOSIS — M6281 Muscle weakness (generalized): Secondary | ICD-10-CM | POA: Diagnosis not present

## 2014-10-11 DIAGNOSIS — H93293 Other abnormal auditory perceptions, bilateral: Secondary | ICD-10-CM | POA: Diagnosis not present

## 2014-10-11 DIAGNOSIS — J069 Acute upper respiratory infection, unspecified: Secondary | ICD-10-CM | POA: Diagnosis not present

## 2014-10-11 DIAGNOSIS — R488 Other symbolic dysfunctions: Secondary | ICD-10-CM | POA: Diagnosis not present

## 2014-10-12 DIAGNOSIS — R488 Other symbolic dysfunctions: Secondary | ICD-10-CM | POA: Diagnosis not present

## 2014-10-12 DIAGNOSIS — M6281 Muscle weakness (generalized): Secondary | ICD-10-CM | POA: Diagnosis not present

## 2014-10-12 DIAGNOSIS — H93293 Other abnormal auditory perceptions, bilateral: Secondary | ICD-10-CM | POA: Diagnosis not present

## 2014-10-12 DIAGNOSIS — R278 Other lack of coordination: Secondary | ICD-10-CM | POA: Diagnosis not present

## 2014-10-12 DIAGNOSIS — J069 Acute upper respiratory infection, unspecified: Secondary | ICD-10-CM | POA: Diagnosis not present

## 2014-10-15 DIAGNOSIS — R488 Other symbolic dysfunctions: Secondary | ICD-10-CM | POA: Diagnosis not present

## 2014-10-15 DIAGNOSIS — R278 Other lack of coordination: Secondary | ICD-10-CM | POA: Diagnosis not present

## 2014-10-15 DIAGNOSIS — J069 Acute upper respiratory infection, unspecified: Secondary | ICD-10-CM | POA: Diagnosis not present

## 2014-10-15 DIAGNOSIS — M6281 Muscle weakness (generalized): Secondary | ICD-10-CM | POA: Diagnosis not present

## 2014-10-15 DIAGNOSIS — H93293 Other abnormal auditory perceptions, bilateral: Secondary | ICD-10-CM | POA: Diagnosis not present

## 2014-10-17 DIAGNOSIS — J069 Acute upper respiratory infection, unspecified: Secondary | ICD-10-CM | POA: Diagnosis not present

## 2014-10-17 DIAGNOSIS — R488 Other symbolic dysfunctions: Secondary | ICD-10-CM | POA: Diagnosis not present

## 2014-10-17 DIAGNOSIS — R278 Other lack of coordination: Secondary | ICD-10-CM | POA: Diagnosis not present

## 2014-10-17 DIAGNOSIS — M6281 Muscle weakness (generalized): Secondary | ICD-10-CM | POA: Diagnosis not present

## 2014-10-17 DIAGNOSIS — H93293 Other abnormal auditory perceptions, bilateral: Secondary | ICD-10-CM | POA: Diagnosis not present

## 2014-10-19 DIAGNOSIS — J069 Acute upper respiratory infection, unspecified: Secondary | ICD-10-CM | POA: Diagnosis not present

## 2014-10-19 DIAGNOSIS — M6281 Muscle weakness (generalized): Secondary | ICD-10-CM | POA: Diagnosis not present

## 2014-10-19 DIAGNOSIS — R488 Other symbolic dysfunctions: Secondary | ICD-10-CM | POA: Diagnosis not present

## 2014-10-19 DIAGNOSIS — H93293 Other abnormal auditory perceptions, bilateral: Secondary | ICD-10-CM | POA: Diagnosis not present

## 2014-10-19 DIAGNOSIS — R278 Other lack of coordination: Secondary | ICD-10-CM | POA: Diagnosis not present

## 2014-10-22 DIAGNOSIS — G301 Alzheimer's disease with late onset: Secondary | ICD-10-CM | POA: Diagnosis not present

## 2014-10-22 DIAGNOSIS — M6281 Muscle weakness (generalized): Secondary | ICD-10-CM | POA: Diagnosis not present

## 2014-10-22 DIAGNOSIS — R278 Other lack of coordination: Secondary | ICD-10-CM | POA: Diagnosis not present

## 2014-10-22 DIAGNOSIS — H93293 Other abnormal auditory perceptions, bilateral: Secondary | ICD-10-CM | POA: Diagnosis not present

## 2014-10-22 DIAGNOSIS — J45909 Unspecified asthma, uncomplicated: Secondary | ICD-10-CM | POA: Diagnosis not present

## 2014-10-22 DIAGNOSIS — R488 Other symbolic dysfunctions: Secondary | ICD-10-CM | POA: Diagnosis not present

## 2014-10-22 DIAGNOSIS — J069 Acute upper respiratory infection, unspecified: Secondary | ICD-10-CM | POA: Diagnosis not present

## 2014-10-23 DIAGNOSIS — R488 Other symbolic dysfunctions: Secondary | ICD-10-CM | POA: Diagnosis not present

## 2014-10-23 DIAGNOSIS — R278 Other lack of coordination: Secondary | ICD-10-CM | POA: Diagnosis not present

## 2014-10-23 DIAGNOSIS — J069 Acute upper respiratory infection, unspecified: Secondary | ICD-10-CM | POA: Diagnosis not present

## 2014-10-23 DIAGNOSIS — M6281 Muscle weakness (generalized): Secondary | ICD-10-CM | POA: Diagnosis not present

## 2014-10-23 DIAGNOSIS — H93293 Other abnormal auditory perceptions, bilateral: Secondary | ICD-10-CM | POA: Diagnosis not present

## 2014-11-07 IMAGING — CR DG KNEE COMPLETE 4+V*L*
4 series · 4 of 4 positions shown · non-contrast
Comparison: No priors.

CLINICAL DATA: History of trauma from a fall complaining of knee
pain.

EXAM:
LEFT KNEE - COMPLETE 4+ VIEW

[t knee ap left]
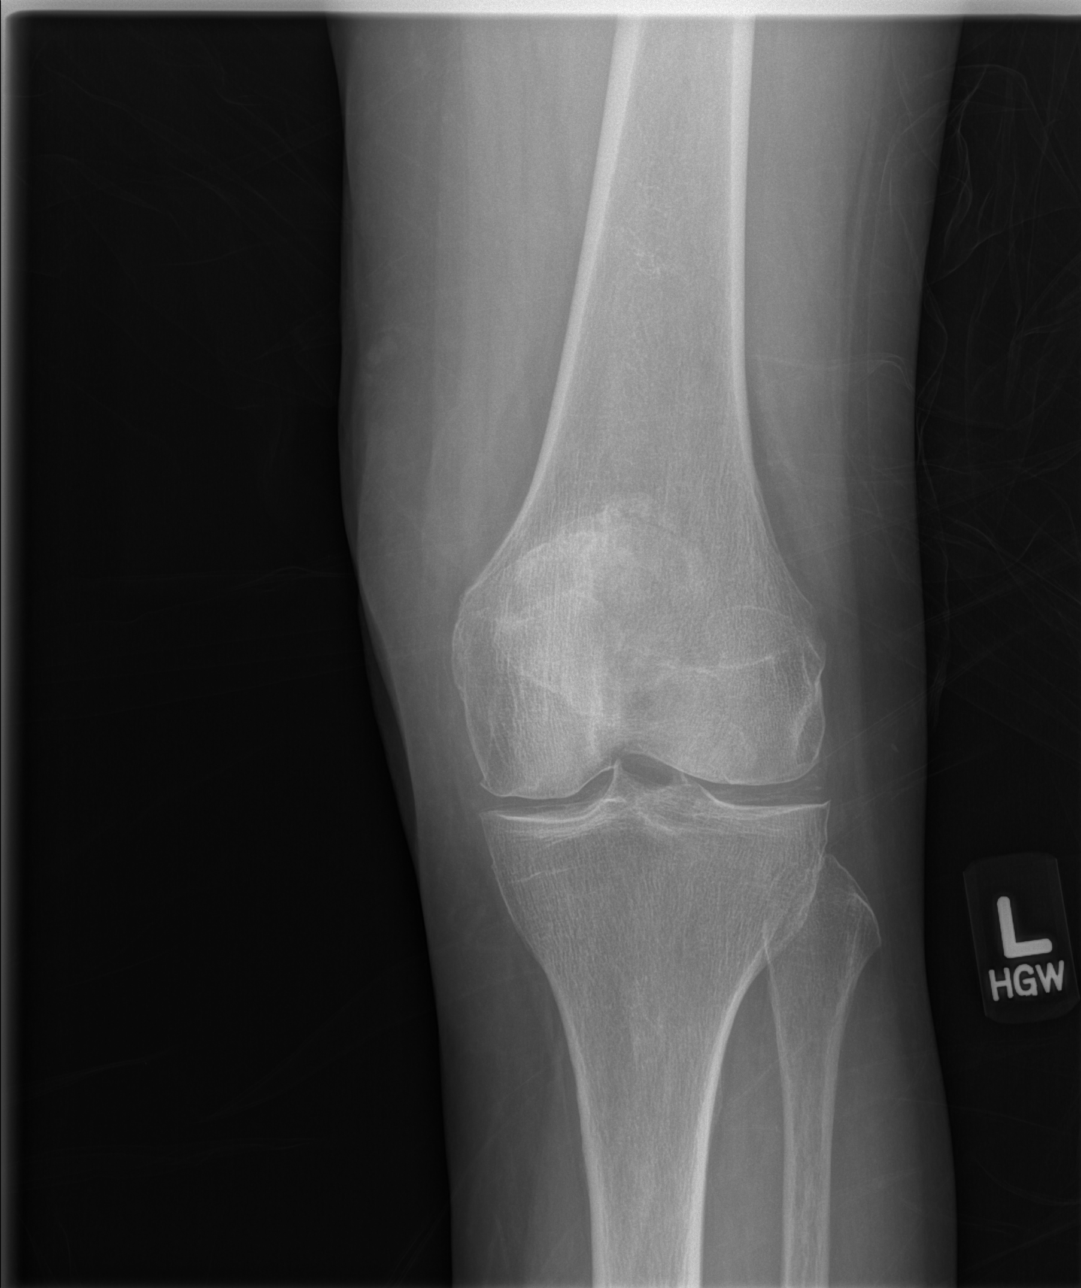

[t knee obl left (1 of 2)]
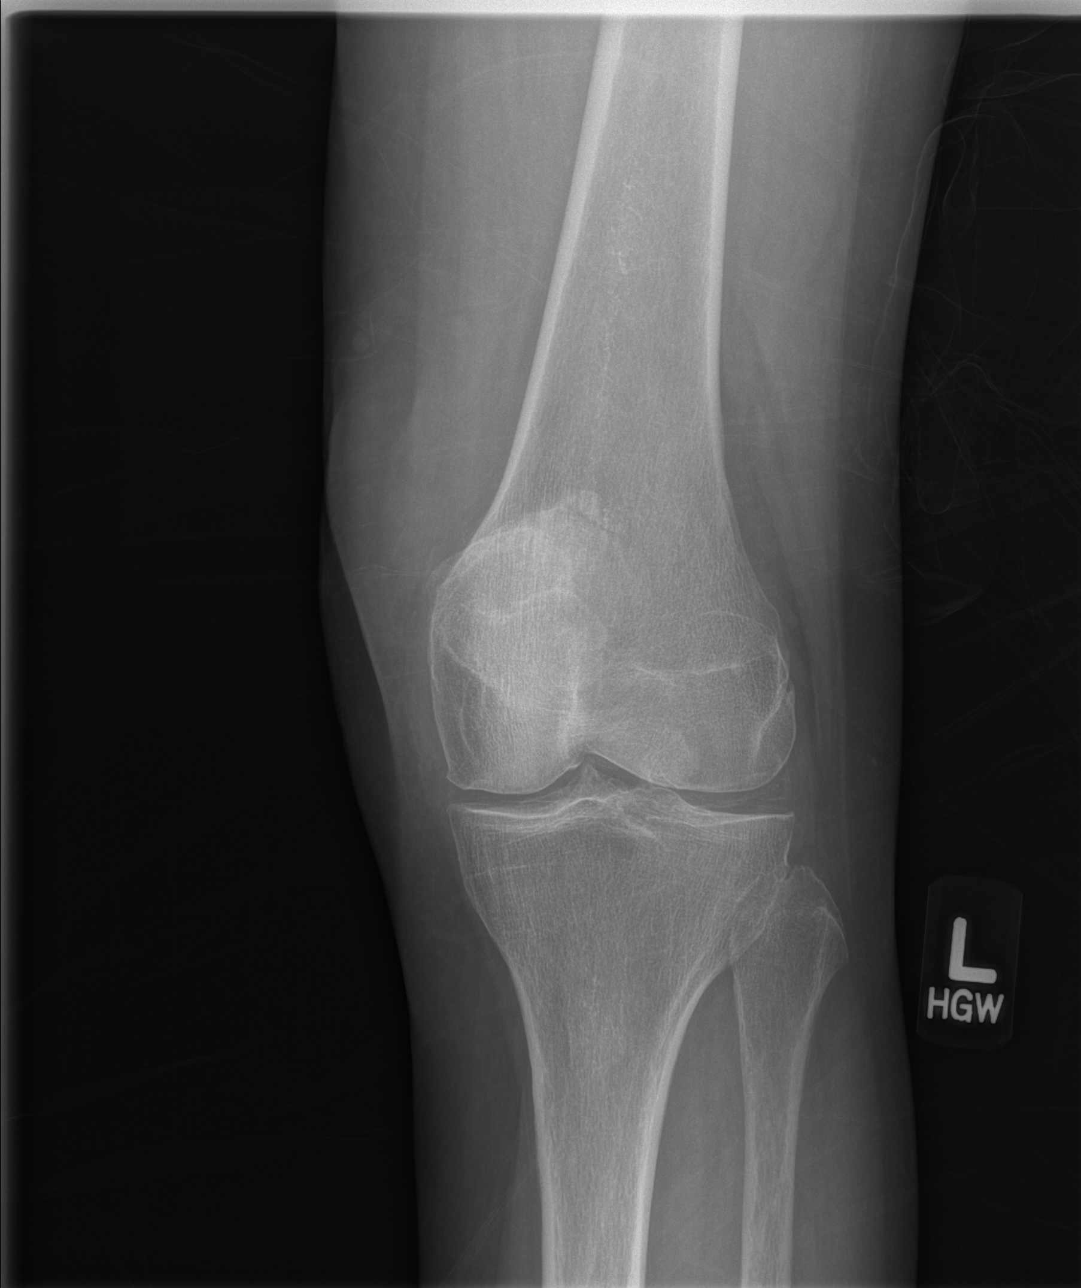

[t knee obl left (2 of 2)]
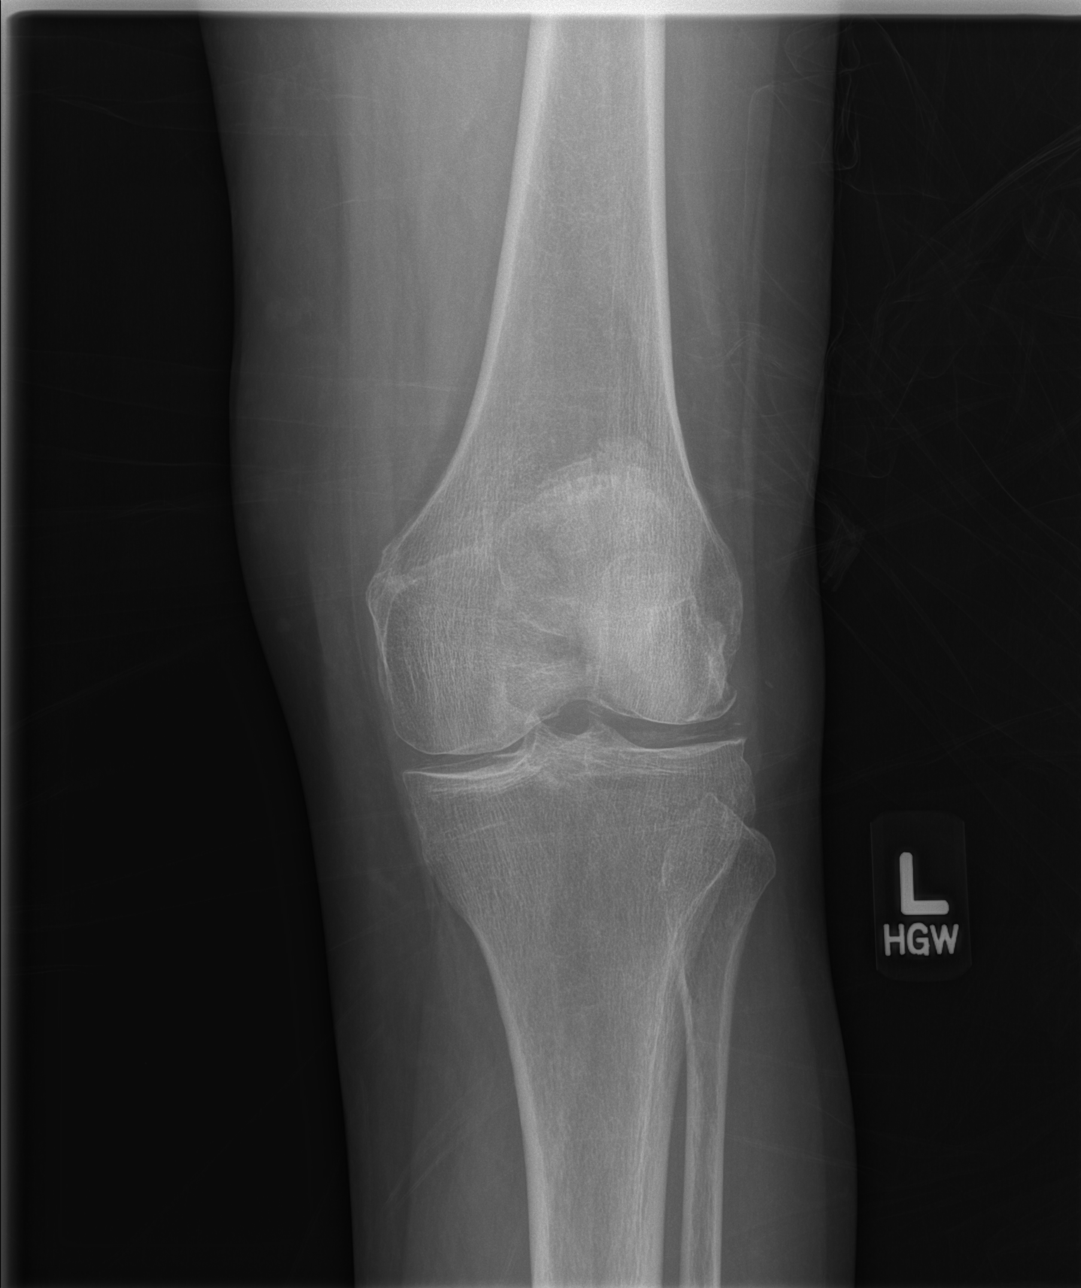

[w knee lat left]
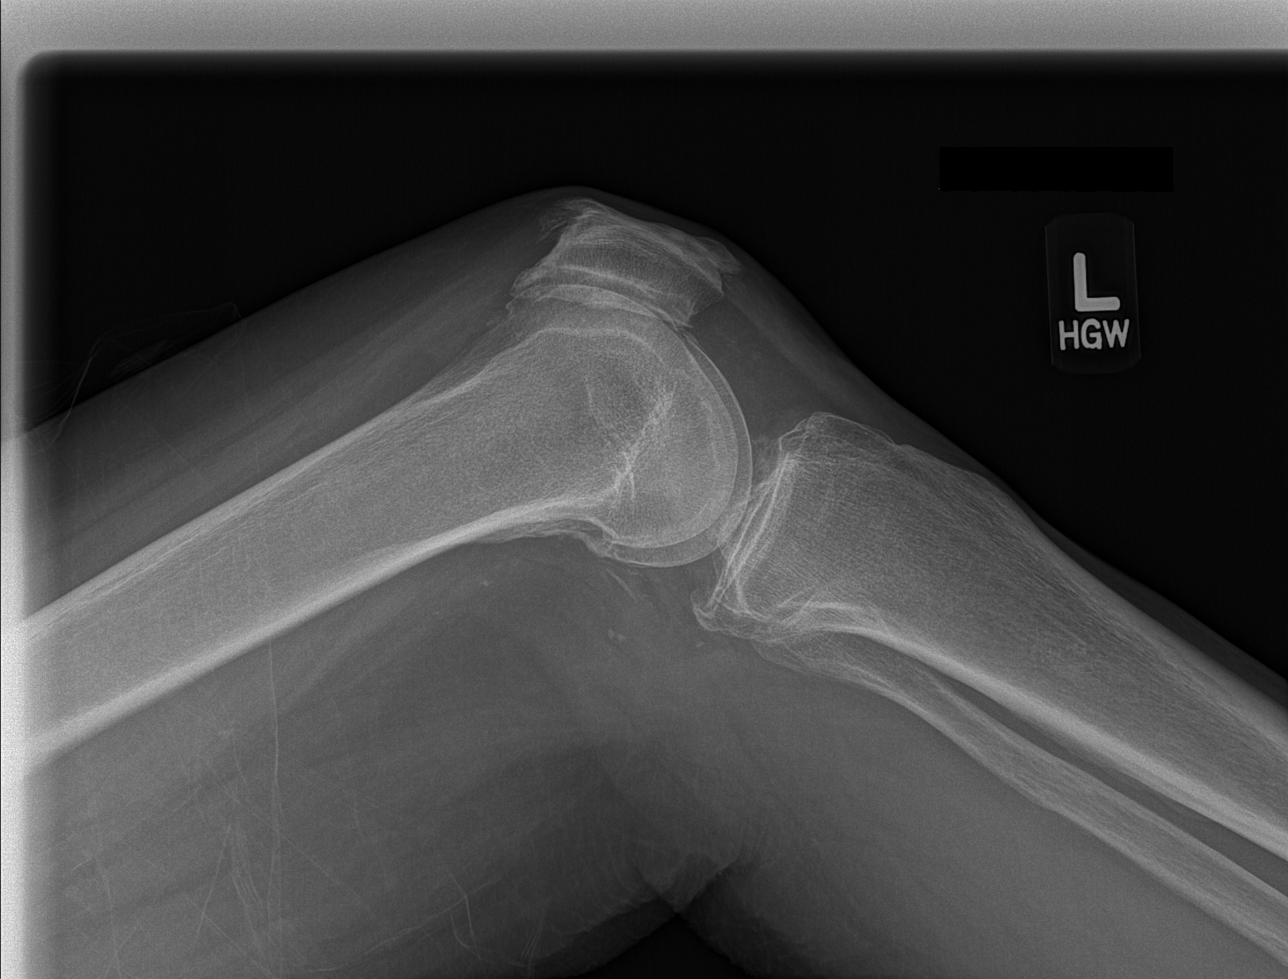

[4 of 4 positions shown; findings below may reference images not displayed]

FINDINGS: Four views of the left knee demonstrate a well-defined lucency
through an enthesophyte extending off the superior aspect of the
patella. There is no other acute displaced fracture, subluxation or
dislocation. There is joint space narrowing, subchondral sclerosis,
subchondral cyst formation and osteophyte formation, most severe in
the medial and patellofemoral compartments, compatible with moderate
to severe osteoarthritis. Faint calcifications in the region of the
menisci are also noted, compatible with chondrocalcinosis.
IMPRESSION: 1. Well-defined lucency through the enthesophyte extending off the
superior aspect of the patella may represent a nondisplaced
fracture. No other signs of significant acute traumatic injury to
the knee are noted at this time.
2. Moderate to severe tricompartmental osteoarthritis, most
pronounced in the medial and patellofemoral compartments.
3. Chondrocalcinosis.

## 2014-11-14 DIAGNOSIS — J4541 Moderate persistent asthma with (acute) exacerbation: Secondary | ICD-10-CM | POA: Diagnosis not present

## 2014-11-14 DIAGNOSIS — J209 Acute bronchitis, unspecified: Secondary | ICD-10-CM | POA: Diagnosis not present

## 2014-12-09 DIAGNOSIS — R05 Cough: Secondary | ICD-10-CM | POA: Diagnosis not present

## 2014-12-12 DIAGNOSIS — J209 Acute bronchitis, unspecified: Secondary | ICD-10-CM | POA: Diagnosis not present

## 2014-12-12 DIAGNOSIS — R05 Cough: Secondary | ICD-10-CM | POA: Diagnosis not present

## 2014-12-12 DIAGNOSIS — R918 Other nonspecific abnormal finding of lung field: Secondary | ICD-10-CM | POA: Diagnosis not present

## 2014-12-31 ENCOUNTER — Ambulatory Visit (INDEPENDENT_AMBULATORY_CARE_PROVIDER_SITE_OTHER): Payer: Medicare Other | Admitting: Podiatry

## 2014-12-31 ENCOUNTER — Encounter: Payer: Self-pay | Admitting: Podiatry

## 2014-12-31 VITALS — BP 137/83 | HR 68 | Resp 18

## 2014-12-31 DIAGNOSIS — M79676 Pain in unspecified toe(s): Secondary | ICD-10-CM

## 2014-12-31 DIAGNOSIS — B351 Tinea unguium: Secondary | ICD-10-CM | POA: Diagnosis not present

## 2015-01-01 NOTE — Progress Notes (Signed)
Subjective:     Patient ID: Michele Maxwell, female   Laurette SchimkeB: 10/04/1922, 81 y.o.   MRN: [id number]  HPI 81 year old female presents the operative consent, discolored, elongated toenails which she is unable to trim herself. She has pain to her nails with pressure and shoegear.  She denies any redness or drainage on the nails. No other complaints at this time.  Review of Systems  All other systems reviewed and are negative.      Objective:   Physical Exam General: AAO x3, NAD  Dermatological: Skin is warm, dry and supple bilateral.  Nails appear to be hypertrophic, dystrophic, brittle, discolored, elongated 10. There is tenderness to palpation overlying nails 1-5 bilaterally. There is no surrounding erythema or drainage on the nail sites. There are no open sores, no preulcerative lesions, no rash or signs of infection present.  Vascular: Dorsalis Pedis artery and Posterior Tibial artery pedal pulses are 2/4 bilateral with immedate capillary fill time. Pedal hair growth present. No varicosities and no lower extremity edema present bilateral. There is no pain with calf compression, swelling, warmth, erythema.   Neruologic: Grossly intact via light touch bilateral. Vibratory intact via tuning fork bilateral. Protective threshold with Semmes Wienstein monofilament intact to all pedal sites bilateral. Patellar and Achilles deep tendon reflexes 2+ bilateral. No Babinski or clonus noted bilateral.   Musculoskeletal: No gross boney pedal deformities bilateral. No pain, crepitus, or limitation noted with foot and ankle range of motion bilateral. Muscular strength 5/5 in all groups tested bilateral.  Gait: Unassisted, Nonantalgic.      Assessment:     Patient presents with symptomatic onychomycosis    Plan:     -Treatment options discussed including all alternatives, risks, and complications -Nail sharply debrided 10 without complications/bleeding. -Discussed importance of daily foot  inspection. -Follow-up in 3 months or sooner if any problems arise. In the meantime, encouraged to call the office with any questions, concerns, change in symptoms.   Ovid CurdMatthew Mishal Probert, DPM

## 2015-01-03 DIAGNOSIS — J45909 Unspecified asthma, uncomplicated: Secondary | ICD-10-CM | POA: Diagnosis not present

## 2015-01-03 DIAGNOSIS — G301 Alzheimer's disease with late onset: Secondary | ICD-10-CM | POA: Diagnosis not present

## 2015-03-07 DIAGNOSIS — G301 Alzheimer's disease with late onset: Secondary | ICD-10-CM | POA: Diagnosis not present

## 2015-03-07 DIAGNOSIS — J45909 Unspecified asthma, uncomplicated: Secondary | ICD-10-CM | POA: Diagnosis not present

## 2015-03-10 DIAGNOSIS — J209 Acute bronchitis, unspecified: Secondary | ICD-10-CM | POA: Diagnosis not present

## 2015-03-20 DIAGNOSIS — L02522 Furuncle left hand: Secondary | ICD-10-CM | POA: Diagnosis not present

## 2015-04-02 ENCOUNTER — Ambulatory Visit (INDEPENDENT_AMBULATORY_CARE_PROVIDER_SITE_OTHER): Payer: Medicare Other | Admitting: Podiatry

## 2015-04-02 ENCOUNTER — Encounter: Payer: Self-pay | Admitting: Podiatry

## 2015-04-02 DIAGNOSIS — M79676 Pain in unspecified toe(s): Secondary | ICD-10-CM

## 2015-04-02 DIAGNOSIS — B351 Tinea unguium: Secondary | ICD-10-CM

## 2015-04-02 NOTE — Progress Notes (Deleted)
   Subjective:    Patient ID: Michele Maxwell, female    DOB: March 09, 1922, 82 y.o.   MRN: [id number]  HPI "This is a 3 month follow-up for nail debridement."    Review of Systems     Objective:   Physical Exam        Assessment & Plan:

## 2015-04-03 NOTE — Progress Notes (Signed)
Patient ID: Michele Maxwell, female   DOB: [birth year], 82 y.o.   MRN: [id number]  Subjective: 82 y.o. returns the office today for painful, elongated, thickened toenails which she cannot trim herself. Denies any redness or drainage around the nails. Denies any acute changes since last appointment and no new complaints today. Denies any systemic complaints such as fevers, chills, nausea, vomiting.   Objective: AAO 3, NAD DP/PT pulses palpable, CRT less than 3 seconds Nails hypertrophic, dystrophic, elongated, brittle, discolored 10. There is tenderness overlying the nails 1-5 bilaterally. There is no surrounding erythema or drainage along the nail sites. No open lesions or pre-ulcerative lesions are identified. No other areas of tenderness bilateral lower extremities. No overlying edema, erythema, increased warmth. No pain with calf compression, swelling, warmth, erythema.  Assessment: Patient presents with symptomatic onychomycosis  Plan: -Treatment options including alternatives, risks, complications were discussed -Nails sharply debrided 10 without complication/bleeding. -Discussed daily foot inspection. If there are any changes, to call the office immediately.  -Follow-up in 3 months or sooner if any problems are to arise. In the meantime, encouraged to call the office with any questions, concerns, changes symptoms.  Ovid Curd, DPM

## 2015-04-04 DIAGNOSIS — M353 Polymyalgia rheumatica: Secondary | ICD-10-CM | POA: Diagnosis not present

## 2015-04-04 DIAGNOSIS — R278 Other lack of coordination: Secondary | ICD-10-CM | POA: Diagnosis not present

## 2015-04-05 DIAGNOSIS — M353 Polymyalgia rheumatica: Secondary | ICD-10-CM | POA: Diagnosis not present

## 2015-04-05 DIAGNOSIS — R278 Other lack of coordination: Secondary | ICD-10-CM | POA: Diagnosis not present

## 2015-04-08 ENCOUNTER — Ambulatory Visit: Payer: Medicare Other | Admitting: Podiatry

## 2015-04-09 DIAGNOSIS — M353 Polymyalgia rheumatica: Secondary | ICD-10-CM | POA: Diagnosis not present

## 2015-04-09 DIAGNOSIS — R278 Other lack of coordination: Secondary | ICD-10-CM | POA: Diagnosis not present

## 2015-04-10 DIAGNOSIS — H26492 Other secondary cataract, left eye: Secondary | ICD-10-CM | POA: Diagnosis not present

## 2015-04-10 DIAGNOSIS — Z961 Presence of intraocular lens: Secondary | ICD-10-CM | POA: Diagnosis not present

## 2015-04-16 DIAGNOSIS — M353 Polymyalgia rheumatica: Secondary | ICD-10-CM | POA: Diagnosis not present

## 2015-04-16 DIAGNOSIS — R278 Other lack of coordination: Secondary | ICD-10-CM | POA: Diagnosis not present

## 2015-04-17 DIAGNOSIS — M353 Polymyalgia rheumatica: Secondary | ICD-10-CM | POA: Diagnosis not present

## 2015-04-17 DIAGNOSIS — R278 Other lack of coordination: Secondary | ICD-10-CM | POA: Diagnosis not present

## 2015-04-22 DIAGNOSIS — N390 Urinary tract infection, site not specified: Secondary | ICD-10-CM | POA: Diagnosis not present

## 2015-05-03 DIAGNOSIS — G301 Alzheimer's disease with late onset: Secondary | ICD-10-CM | POA: Diagnosis not present

## 2015-05-03 DIAGNOSIS — J45909 Unspecified asthma, uncomplicated: Secondary | ICD-10-CM | POA: Diagnosis not present

## 2015-06-05 DIAGNOSIS — J45909 Unspecified asthma, uncomplicated: Secondary | ICD-10-CM | POA: Diagnosis not present

## 2015-06-05 DIAGNOSIS — G301 Alzheimer's disease with late onset: Secondary | ICD-10-CM | POA: Diagnosis not present

## 2015-06-05 DIAGNOSIS — K219 Gastro-esophageal reflux disease without esophagitis: Secondary | ICD-10-CM | POA: Diagnosis not present

## 2015-07-05 DIAGNOSIS — H903 Sensorineural hearing loss, bilateral: Secondary | ICD-10-CM | POA: Diagnosis not present

## 2015-07-09 ENCOUNTER — Ambulatory Visit: Payer: Medicare Other | Admitting: Sports Medicine

## 2015-07-09 DIAGNOSIS — H903 Sensorineural hearing loss, bilateral: Secondary | ICD-10-CM | POA: Diagnosis not present

## 2015-07-23 ENCOUNTER — Ambulatory Visit: Payer: Medicare Other | Admitting: Sports Medicine

## 2015-08-04 DIAGNOSIS — J45909 Unspecified asthma, uncomplicated: Secondary | ICD-10-CM | POA: Diagnosis not present

## 2015-08-04 DIAGNOSIS — G301 Alzheimer's disease with late onset: Secondary | ICD-10-CM | POA: Diagnosis not present

## 2015-08-04 DIAGNOSIS — M353 Polymyalgia rheumatica: Secondary | ICD-10-CM | POA: Diagnosis not present

## 2015-08-06 DIAGNOSIS — M2550 Pain in unspecified joint: Secondary | ICD-10-CM | POA: Diagnosis not present

## 2015-08-06 DIAGNOSIS — I509 Heart failure, unspecified: Secondary | ICD-10-CM | POA: Diagnosis not present

## 2015-08-26 DIAGNOSIS — R05 Cough: Secondary | ICD-10-CM | POA: Diagnosis not present

## 2015-10-10 DIAGNOSIS — G301 Alzheimer's disease with late onset: Secondary | ICD-10-CM | POA: Diagnosis not present

## 2015-10-10 DIAGNOSIS — J4530 Mild persistent asthma, uncomplicated: Secondary | ICD-10-CM | POA: Diagnosis not present

## 2015-10-10 DIAGNOSIS — M353 Polymyalgia rheumatica: Secondary | ICD-10-CM | POA: Diagnosis not present

## 2015-10-11 DIAGNOSIS — M1000 Idiopathic gout, unspecified site: Secondary | ICD-10-CM | POA: Diagnosis not present

## 2015-10-11 DIAGNOSIS — M2550 Pain in unspecified joint: Secondary | ICD-10-CM | POA: Diagnosis not present

## 2015-11-27 DIAGNOSIS — J069 Acute upper respiratory infection, unspecified: Secondary | ICD-10-CM | POA: Diagnosis not present

## 2015-11-27 DIAGNOSIS — R2689 Other abnormalities of gait and mobility: Secondary | ICD-10-CM | POA: Diagnosis not present

## 2015-11-27 DIAGNOSIS — J202 Acute bronchitis due to streptococcus: Secondary | ICD-10-CM | POA: Diagnosis not present

## 2015-11-28 DIAGNOSIS — R2689 Other abnormalities of gait and mobility: Secondary | ICD-10-CM | POA: Diagnosis not present

## 2015-11-28 DIAGNOSIS — J202 Acute bronchitis due to streptococcus: Secondary | ICD-10-CM | POA: Diagnosis not present

## 2015-11-28 DIAGNOSIS — J069 Acute upper respiratory infection, unspecified: Secondary | ICD-10-CM | POA: Diagnosis not present

## 2015-11-29 DIAGNOSIS — R2689 Other abnormalities of gait and mobility: Secondary | ICD-10-CM | POA: Diagnosis not present

## 2015-11-29 DIAGNOSIS — J202 Acute bronchitis due to streptococcus: Secondary | ICD-10-CM | POA: Diagnosis not present

## 2015-11-29 DIAGNOSIS — J069 Acute upper respiratory infection, unspecified: Secondary | ICD-10-CM | POA: Diagnosis not present

## 2015-12-02 DIAGNOSIS — R2689 Other abnormalities of gait and mobility: Secondary | ICD-10-CM | POA: Diagnosis not present

## 2015-12-02 DIAGNOSIS — J202 Acute bronchitis due to streptococcus: Secondary | ICD-10-CM | POA: Diagnosis not present

## 2015-12-02 DIAGNOSIS — J069 Acute upper respiratory infection, unspecified: Secondary | ICD-10-CM | POA: Diagnosis not present

## 2015-12-03 DIAGNOSIS — R2689 Other abnormalities of gait and mobility: Secondary | ICD-10-CM | POA: Diagnosis not present

## 2015-12-03 DIAGNOSIS — J069 Acute upper respiratory infection, unspecified: Secondary | ICD-10-CM | POA: Diagnosis not present

## 2015-12-03 DIAGNOSIS — J202 Acute bronchitis due to streptococcus: Secondary | ICD-10-CM | POA: Diagnosis not present

## 2015-12-04 DIAGNOSIS — J202 Acute bronchitis due to streptococcus: Secondary | ICD-10-CM | POA: Diagnosis not present

## 2015-12-04 DIAGNOSIS — J069 Acute upper respiratory infection, unspecified: Secondary | ICD-10-CM | POA: Diagnosis not present

## 2015-12-04 DIAGNOSIS — R2689 Other abnormalities of gait and mobility: Secondary | ICD-10-CM | POA: Diagnosis not present

## 2015-12-05 DIAGNOSIS — R2689 Other abnormalities of gait and mobility: Secondary | ICD-10-CM | POA: Diagnosis not present

## 2015-12-05 DIAGNOSIS — J069 Acute upper respiratory infection, unspecified: Secondary | ICD-10-CM | POA: Diagnosis not present

## 2015-12-05 DIAGNOSIS — J202 Acute bronchitis due to streptococcus: Secondary | ICD-10-CM | POA: Diagnosis not present

## 2015-12-06 DIAGNOSIS — R2689 Other abnormalities of gait and mobility: Secondary | ICD-10-CM | POA: Diagnosis not present

## 2015-12-06 DIAGNOSIS — J069 Acute upper respiratory infection, unspecified: Secondary | ICD-10-CM | POA: Diagnosis not present

## 2015-12-06 DIAGNOSIS — J202 Acute bronchitis due to streptococcus: Secondary | ICD-10-CM | POA: Diagnosis not present

## 2015-12-09 DIAGNOSIS — J202 Acute bronchitis due to streptococcus: Secondary | ICD-10-CM | POA: Diagnosis not present

## 2015-12-09 DIAGNOSIS — R2689 Other abnormalities of gait and mobility: Secondary | ICD-10-CM | POA: Diagnosis not present

## 2015-12-09 DIAGNOSIS — J069 Acute upper respiratory infection, unspecified: Secondary | ICD-10-CM | POA: Diagnosis not present

## 2015-12-10 DIAGNOSIS — J202 Acute bronchitis due to streptococcus: Secondary | ICD-10-CM | POA: Diagnosis not present

## 2015-12-10 DIAGNOSIS — R2689 Other abnormalities of gait and mobility: Secondary | ICD-10-CM | POA: Diagnosis not present

## 2015-12-10 DIAGNOSIS — J069 Acute upper respiratory infection, unspecified: Secondary | ICD-10-CM | POA: Diagnosis not present

## 2015-12-11 DIAGNOSIS — J202 Acute bronchitis due to streptococcus: Secondary | ICD-10-CM | POA: Diagnosis not present

## 2015-12-11 DIAGNOSIS — J069 Acute upper respiratory infection, unspecified: Secondary | ICD-10-CM | POA: Diagnosis not present

## 2015-12-11 DIAGNOSIS — R2689 Other abnormalities of gait and mobility: Secondary | ICD-10-CM | POA: Diagnosis not present

## 2015-12-12 DIAGNOSIS — J202 Acute bronchitis due to streptococcus: Secondary | ICD-10-CM | POA: Diagnosis not present

## 2015-12-12 DIAGNOSIS — R2689 Other abnormalities of gait and mobility: Secondary | ICD-10-CM | POA: Diagnosis not present

## 2015-12-12 DIAGNOSIS — J4520 Mild intermittent asthma, uncomplicated: Secondary | ICD-10-CM | POA: Diagnosis not present

## 2015-12-12 DIAGNOSIS — G301 Alzheimer's disease with late onset: Secondary | ICD-10-CM | POA: Diagnosis not present

## 2015-12-12 DIAGNOSIS — M353 Polymyalgia rheumatica: Secondary | ICD-10-CM | POA: Diagnosis not present

## 2015-12-12 DIAGNOSIS — J069 Acute upper respiratory infection, unspecified: Secondary | ICD-10-CM | POA: Diagnosis not present

## 2015-12-13 DIAGNOSIS — J202 Acute bronchitis due to streptococcus: Secondary | ICD-10-CM | POA: Diagnosis not present

## 2015-12-13 DIAGNOSIS — J069 Acute upper respiratory infection, unspecified: Secondary | ICD-10-CM | POA: Diagnosis not present

## 2015-12-13 DIAGNOSIS — R2689 Other abnormalities of gait and mobility: Secondary | ICD-10-CM | POA: Diagnosis not present

## 2015-12-16 DIAGNOSIS — R2689 Other abnormalities of gait and mobility: Secondary | ICD-10-CM | POA: Diagnosis not present

## 2015-12-16 DIAGNOSIS — J202 Acute bronchitis due to streptococcus: Secondary | ICD-10-CM | POA: Diagnosis not present

## 2015-12-16 DIAGNOSIS — J069 Acute upper respiratory infection, unspecified: Secondary | ICD-10-CM | POA: Diagnosis not present

## 2015-12-17 DIAGNOSIS — R2689 Other abnormalities of gait and mobility: Secondary | ICD-10-CM | POA: Diagnosis not present

## 2015-12-17 DIAGNOSIS — J069 Acute upper respiratory infection, unspecified: Secondary | ICD-10-CM | POA: Diagnosis not present

## 2015-12-17 DIAGNOSIS — J202 Acute bronchitis due to streptococcus: Secondary | ICD-10-CM | POA: Diagnosis not present

## 2015-12-18 DIAGNOSIS — R2689 Other abnormalities of gait and mobility: Secondary | ICD-10-CM | POA: Diagnosis not present

## 2015-12-18 DIAGNOSIS — J069 Acute upper respiratory infection, unspecified: Secondary | ICD-10-CM | POA: Diagnosis not present

## 2015-12-18 DIAGNOSIS — J202 Acute bronchitis due to streptococcus: Secondary | ICD-10-CM | POA: Diagnosis not present

## 2015-12-19 DIAGNOSIS — R2689 Other abnormalities of gait and mobility: Secondary | ICD-10-CM | POA: Diagnosis not present

## 2015-12-19 DIAGNOSIS — J202 Acute bronchitis due to streptococcus: Secondary | ICD-10-CM | POA: Diagnosis not present

## 2015-12-19 DIAGNOSIS — J069 Acute upper respiratory infection, unspecified: Secondary | ICD-10-CM | POA: Diagnosis not present

## 2015-12-20 DIAGNOSIS — R2689 Other abnormalities of gait and mobility: Secondary | ICD-10-CM | POA: Diagnosis not present

## 2015-12-20 DIAGNOSIS — J202 Acute bronchitis due to streptococcus: Secondary | ICD-10-CM | POA: Diagnosis not present

## 2015-12-20 DIAGNOSIS — J069 Acute upper respiratory infection, unspecified: Secondary | ICD-10-CM | POA: Diagnosis not present

## 2015-12-23 DIAGNOSIS — J069 Acute upper respiratory infection, unspecified: Secondary | ICD-10-CM | POA: Diagnosis not present

## 2015-12-23 DIAGNOSIS — J202 Acute bronchitis due to streptococcus: Secondary | ICD-10-CM | POA: Diagnosis not present

## 2015-12-23 DIAGNOSIS — R2689 Other abnormalities of gait and mobility: Secondary | ICD-10-CM | POA: Diagnosis not present

## 2015-12-24 DIAGNOSIS — J202 Acute bronchitis due to streptococcus: Secondary | ICD-10-CM | POA: Diagnosis not present

## 2015-12-24 DIAGNOSIS — J069 Acute upper respiratory infection, unspecified: Secondary | ICD-10-CM | POA: Diagnosis not present

## 2015-12-24 DIAGNOSIS — R2689 Other abnormalities of gait and mobility: Secondary | ICD-10-CM | POA: Diagnosis not present

## 2015-12-26 DIAGNOSIS — L03113 Cellulitis of right upper limb: Secondary | ICD-10-CM | POA: Diagnosis not present

## 2016-02-06 DIAGNOSIS — J45909 Unspecified asthma, uncomplicated: Secondary | ICD-10-CM | POA: Diagnosis not present

## 2016-02-06 DIAGNOSIS — M353 Polymyalgia rheumatica: Secondary | ICD-10-CM | POA: Diagnosis not present

## 2016-02-06 DIAGNOSIS — G301 Alzheimer's disease with late onset: Secondary | ICD-10-CM | POA: Diagnosis not present

## 2016-02-28 DIAGNOSIS — F0390 Unspecified dementia without behavioral disturbance: Secondary | ICD-10-CM | POA: Diagnosis not present

## 2016-02-28 DIAGNOSIS — R41841 Cognitive communication deficit: Secondary | ICD-10-CM | POA: Diagnosis not present

## 2016-02-28 DIAGNOSIS — R4182 Altered mental status, unspecified: Secondary | ICD-10-CM | POA: Diagnosis not present

## 2016-02-29 DIAGNOSIS — R4182 Altered mental status, unspecified: Secondary | ICD-10-CM | POA: Diagnosis not present

## 2016-02-29 DIAGNOSIS — R41841 Cognitive communication deficit: Secondary | ICD-10-CM | POA: Diagnosis not present

## 2016-02-29 DIAGNOSIS — F0390 Unspecified dementia without behavioral disturbance: Secondary | ICD-10-CM | POA: Diagnosis not present

## 2016-03-03 DIAGNOSIS — R262 Difficulty in walking, not elsewhere classified: Secondary | ICD-10-CM | POA: Diagnosis not present

## 2016-03-03 DIAGNOSIS — R062 Wheezing: Secondary | ICD-10-CM | POA: Diagnosis not present

## 2016-03-03 DIAGNOSIS — F0390 Unspecified dementia without behavioral disturbance: Secondary | ICD-10-CM | POA: Diagnosis not present

## 2016-03-03 DIAGNOSIS — R41841 Cognitive communication deficit: Secondary | ICD-10-CM | POA: Diagnosis not present

## 2016-03-03 DIAGNOSIS — R4182 Altered mental status, unspecified: Secondary | ICD-10-CM | POA: Diagnosis not present

## 2016-03-04 DIAGNOSIS — R41841 Cognitive communication deficit: Secondary | ICD-10-CM | POA: Diagnosis not present

## 2016-03-04 DIAGNOSIS — R062 Wheezing: Secondary | ICD-10-CM | POA: Diagnosis not present

## 2016-03-04 DIAGNOSIS — F0390 Unspecified dementia without behavioral disturbance: Secondary | ICD-10-CM | POA: Diagnosis not present

## 2016-03-04 DIAGNOSIS — R4182 Altered mental status, unspecified: Secondary | ICD-10-CM | POA: Diagnosis not present

## 2016-03-04 DIAGNOSIS — R262 Difficulty in walking, not elsewhere classified: Secondary | ICD-10-CM | POA: Diagnosis not present

## 2016-03-05 DIAGNOSIS — F0390 Unspecified dementia without behavioral disturbance: Secondary | ICD-10-CM | POA: Diagnosis not present

## 2016-03-05 DIAGNOSIS — R062 Wheezing: Secondary | ICD-10-CM | POA: Diagnosis not present

## 2016-03-05 DIAGNOSIS — R262 Difficulty in walking, not elsewhere classified: Secondary | ICD-10-CM | POA: Diagnosis not present

## 2016-03-05 DIAGNOSIS — R4182 Altered mental status, unspecified: Secondary | ICD-10-CM | POA: Diagnosis not present

## 2016-03-05 DIAGNOSIS — R41841 Cognitive communication deficit: Secondary | ICD-10-CM | POA: Diagnosis not present

## 2016-03-06 DIAGNOSIS — R4182 Altered mental status, unspecified: Secondary | ICD-10-CM | POA: Diagnosis not present

## 2016-03-06 DIAGNOSIS — F0390 Unspecified dementia without behavioral disturbance: Secondary | ICD-10-CM | POA: Diagnosis not present

## 2016-03-06 DIAGNOSIS — R41841 Cognitive communication deficit: Secondary | ICD-10-CM | POA: Diagnosis not present

## 2016-03-06 DIAGNOSIS — R262 Difficulty in walking, not elsewhere classified: Secondary | ICD-10-CM | POA: Diagnosis not present

## 2016-03-06 DIAGNOSIS — R062 Wheezing: Secondary | ICD-10-CM | POA: Diagnosis not present

## 2016-03-09 DIAGNOSIS — R41841 Cognitive communication deficit: Secondary | ICD-10-CM | POA: Diagnosis not present

## 2016-03-09 DIAGNOSIS — R4182 Altered mental status, unspecified: Secondary | ICD-10-CM | POA: Diagnosis not present

## 2016-03-09 DIAGNOSIS — F0390 Unspecified dementia without behavioral disturbance: Secondary | ICD-10-CM | POA: Diagnosis not present

## 2016-03-09 DIAGNOSIS — R062 Wheezing: Secondary | ICD-10-CM | POA: Diagnosis not present

## 2016-03-09 DIAGNOSIS — R262 Difficulty in walking, not elsewhere classified: Secondary | ICD-10-CM | POA: Diagnosis not present

## 2016-03-10 DIAGNOSIS — R4182 Altered mental status, unspecified: Secondary | ICD-10-CM | POA: Diagnosis not present

## 2016-03-10 DIAGNOSIS — R41841 Cognitive communication deficit: Secondary | ICD-10-CM | POA: Diagnosis not present

## 2016-03-10 DIAGNOSIS — R062 Wheezing: Secondary | ICD-10-CM | POA: Diagnosis not present

## 2016-03-10 DIAGNOSIS — R262 Difficulty in walking, not elsewhere classified: Secondary | ICD-10-CM | POA: Diagnosis not present

## 2016-03-10 DIAGNOSIS — F0390 Unspecified dementia without behavioral disturbance: Secondary | ICD-10-CM | POA: Diagnosis not present

## 2016-03-11 DIAGNOSIS — R41841 Cognitive communication deficit: Secondary | ICD-10-CM | POA: Diagnosis not present

## 2016-03-11 DIAGNOSIS — R4182 Altered mental status, unspecified: Secondary | ICD-10-CM | POA: Diagnosis not present

## 2016-03-11 DIAGNOSIS — R262 Difficulty in walking, not elsewhere classified: Secondary | ICD-10-CM | POA: Diagnosis not present

## 2016-03-11 DIAGNOSIS — F0390 Unspecified dementia without behavioral disturbance: Secondary | ICD-10-CM | POA: Diagnosis not present

## 2016-03-11 DIAGNOSIS — R062 Wheezing: Secondary | ICD-10-CM | POA: Diagnosis not present

## 2016-03-12 DIAGNOSIS — R262 Difficulty in walking, not elsewhere classified: Secondary | ICD-10-CM | POA: Diagnosis not present

## 2016-03-12 DIAGNOSIS — F0390 Unspecified dementia without behavioral disturbance: Secondary | ICD-10-CM | POA: Diagnosis not present

## 2016-03-12 DIAGNOSIS — R4182 Altered mental status, unspecified: Secondary | ICD-10-CM | POA: Diagnosis not present

## 2016-03-12 DIAGNOSIS — R062 Wheezing: Secondary | ICD-10-CM | POA: Diagnosis not present

## 2016-03-12 DIAGNOSIS — R41841 Cognitive communication deficit: Secondary | ICD-10-CM | POA: Diagnosis not present

## 2016-03-13 DIAGNOSIS — R062 Wheezing: Secondary | ICD-10-CM | POA: Diagnosis not present

## 2016-03-13 DIAGNOSIS — R4182 Altered mental status, unspecified: Secondary | ICD-10-CM | POA: Diagnosis not present

## 2016-03-13 DIAGNOSIS — R41841 Cognitive communication deficit: Secondary | ICD-10-CM | POA: Diagnosis not present

## 2016-03-13 DIAGNOSIS — F0390 Unspecified dementia without behavioral disturbance: Secondary | ICD-10-CM | POA: Diagnosis not present

## 2016-03-13 DIAGNOSIS — R262 Difficulty in walking, not elsewhere classified: Secondary | ICD-10-CM | POA: Diagnosis not present

## 2016-03-15 DIAGNOSIS — R062 Wheezing: Secondary | ICD-10-CM | POA: Diagnosis not present

## 2016-03-15 DIAGNOSIS — R41841 Cognitive communication deficit: Secondary | ICD-10-CM | POA: Diagnosis not present

## 2016-03-15 DIAGNOSIS — R262 Difficulty in walking, not elsewhere classified: Secondary | ICD-10-CM | POA: Diagnosis not present

## 2016-03-15 DIAGNOSIS — F0390 Unspecified dementia without behavioral disturbance: Secondary | ICD-10-CM | POA: Diagnosis not present

## 2016-03-15 DIAGNOSIS — R4182 Altered mental status, unspecified: Secondary | ICD-10-CM | POA: Diagnosis not present

## 2016-03-17 DIAGNOSIS — R062 Wheezing: Secondary | ICD-10-CM | POA: Diagnosis not present

## 2016-03-17 DIAGNOSIS — R262 Difficulty in walking, not elsewhere classified: Secondary | ICD-10-CM | POA: Diagnosis not present

## 2016-03-17 DIAGNOSIS — F0390 Unspecified dementia without behavioral disturbance: Secondary | ICD-10-CM | POA: Diagnosis not present

## 2016-03-17 DIAGNOSIS — R4182 Altered mental status, unspecified: Secondary | ICD-10-CM | POA: Diagnosis not present

## 2016-03-17 DIAGNOSIS — R41841 Cognitive communication deficit: Secondary | ICD-10-CM | POA: Diagnosis not present

## 2016-03-18 DIAGNOSIS — R062 Wheezing: Secondary | ICD-10-CM | POA: Diagnosis not present

## 2016-03-18 DIAGNOSIS — R4182 Altered mental status, unspecified: Secondary | ICD-10-CM | POA: Diagnosis not present

## 2016-03-18 DIAGNOSIS — R262 Difficulty in walking, not elsewhere classified: Secondary | ICD-10-CM | POA: Diagnosis not present

## 2016-03-18 DIAGNOSIS — F0390 Unspecified dementia without behavioral disturbance: Secondary | ICD-10-CM | POA: Diagnosis not present

## 2016-03-18 DIAGNOSIS — R41841 Cognitive communication deficit: Secondary | ICD-10-CM | POA: Diagnosis not present

## 2016-03-19 DIAGNOSIS — R262 Difficulty in walking, not elsewhere classified: Secondary | ICD-10-CM | POA: Diagnosis not present

## 2016-03-19 DIAGNOSIS — R4182 Altered mental status, unspecified: Secondary | ICD-10-CM | POA: Diagnosis not present

## 2016-03-19 DIAGNOSIS — R41841 Cognitive communication deficit: Secondary | ICD-10-CM | POA: Diagnosis not present

## 2016-03-19 DIAGNOSIS — R062 Wheezing: Secondary | ICD-10-CM | POA: Diagnosis not present

## 2016-03-19 DIAGNOSIS — F0390 Unspecified dementia without behavioral disturbance: Secondary | ICD-10-CM | POA: Diagnosis not present

## 2016-03-20 DIAGNOSIS — R41841 Cognitive communication deficit: Secondary | ICD-10-CM | POA: Diagnosis not present

## 2016-03-20 DIAGNOSIS — R062 Wheezing: Secondary | ICD-10-CM | POA: Diagnosis not present

## 2016-03-20 DIAGNOSIS — R262 Difficulty in walking, not elsewhere classified: Secondary | ICD-10-CM | POA: Diagnosis not present

## 2016-03-20 DIAGNOSIS — F0390 Unspecified dementia without behavioral disturbance: Secondary | ICD-10-CM | POA: Diagnosis not present

## 2016-03-20 DIAGNOSIS — R4182 Altered mental status, unspecified: Secondary | ICD-10-CM | POA: Diagnosis not present

## 2016-03-21 DIAGNOSIS — R062 Wheezing: Secondary | ICD-10-CM | POA: Diagnosis not present

## 2016-03-21 DIAGNOSIS — R4182 Altered mental status, unspecified: Secondary | ICD-10-CM | POA: Diagnosis not present

## 2016-03-21 DIAGNOSIS — R262 Difficulty in walking, not elsewhere classified: Secondary | ICD-10-CM | POA: Diagnosis not present

## 2016-03-21 DIAGNOSIS — F0390 Unspecified dementia without behavioral disturbance: Secondary | ICD-10-CM | POA: Diagnosis not present

## 2016-03-21 DIAGNOSIS — R41841 Cognitive communication deficit: Secondary | ICD-10-CM | POA: Diagnosis not present

## 2016-03-23 DIAGNOSIS — R262 Difficulty in walking, not elsewhere classified: Secondary | ICD-10-CM | POA: Diagnosis not present

## 2016-03-23 DIAGNOSIS — F0390 Unspecified dementia without behavioral disturbance: Secondary | ICD-10-CM | POA: Diagnosis not present

## 2016-03-23 DIAGNOSIS — R062 Wheezing: Secondary | ICD-10-CM | POA: Diagnosis not present

## 2016-03-23 DIAGNOSIS — R41841 Cognitive communication deficit: Secondary | ICD-10-CM | POA: Diagnosis not present

## 2016-03-23 DIAGNOSIS — R4182 Altered mental status, unspecified: Secondary | ICD-10-CM | POA: Diagnosis not present

## 2016-03-24 DIAGNOSIS — R4182 Altered mental status, unspecified: Secondary | ICD-10-CM | POA: Diagnosis not present

## 2016-03-24 DIAGNOSIS — R262 Difficulty in walking, not elsewhere classified: Secondary | ICD-10-CM | POA: Diagnosis not present

## 2016-03-24 DIAGNOSIS — R41841 Cognitive communication deficit: Secondary | ICD-10-CM | POA: Diagnosis not present

## 2016-03-24 DIAGNOSIS — R062 Wheezing: Secondary | ICD-10-CM | POA: Diagnosis not present

## 2016-03-24 DIAGNOSIS — F0390 Unspecified dementia without behavioral disturbance: Secondary | ICD-10-CM | POA: Diagnosis not present

## 2016-03-25 DIAGNOSIS — R062 Wheezing: Secondary | ICD-10-CM | POA: Diagnosis not present

## 2016-03-25 DIAGNOSIS — R262 Difficulty in walking, not elsewhere classified: Secondary | ICD-10-CM | POA: Diagnosis not present

## 2016-03-25 DIAGNOSIS — R4182 Altered mental status, unspecified: Secondary | ICD-10-CM | POA: Diagnosis not present

## 2016-03-25 DIAGNOSIS — F0390 Unspecified dementia without behavioral disturbance: Secondary | ICD-10-CM | POA: Diagnosis not present

## 2016-03-25 DIAGNOSIS — R41841 Cognitive communication deficit: Secondary | ICD-10-CM | POA: Diagnosis not present

## 2016-03-26 DIAGNOSIS — R062 Wheezing: Secondary | ICD-10-CM | POA: Diagnosis not present

## 2016-03-26 DIAGNOSIS — R4182 Altered mental status, unspecified: Secondary | ICD-10-CM | POA: Diagnosis not present

## 2016-03-26 DIAGNOSIS — R262 Difficulty in walking, not elsewhere classified: Secondary | ICD-10-CM | POA: Diagnosis not present

## 2016-03-26 DIAGNOSIS — F0390 Unspecified dementia without behavioral disturbance: Secondary | ICD-10-CM | POA: Diagnosis not present

## 2016-03-26 DIAGNOSIS — R41841 Cognitive communication deficit: Secondary | ICD-10-CM | POA: Diagnosis not present

## 2016-03-30 DIAGNOSIS — R262 Difficulty in walking, not elsewhere classified: Secondary | ICD-10-CM | POA: Diagnosis not present

## 2016-03-30 DIAGNOSIS — R41841 Cognitive communication deficit: Secondary | ICD-10-CM | POA: Diagnosis not present

## 2016-03-30 DIAGNOSIS — F0390 Unspecified dementia without behavioral disturbance: Secondary | ICD-10-CM | POA: Diagnosis not present

## 2016-03-30 DIAGNOSIS — R4182 Altered mental status, unspecified: Secondary | ICD-10-CM | POA: Diagnosis not present

## 2016-03-30 DIAGNOSIS — R062 Wheezing: Secondary | ICD-10-CM | POA: Diagnosis not present

## 2016-03-31 DIAGNOSIS — F0390 Unspecified dementia without behavioral disturbance: Secondary | ICD-10-CM | POA: Diagnosis not present

## 2016-03-31 DIAGNOSIS — R062 Wheezing: Secondary | ICD-10-CM | POA: Diagnosis not present

## 2016-03-31 DIAGNOSIS — R262 Difficulty in walking, not elsewhere classified: Secondary | ICD-10-CM | POA: Diagnosis not present

## 2016-03-31 DIAGNOSIS — R41841 Cognitive communication deficit: Secondary | ICD-10-CM | POA: Diagnosis not present

## 2016-03-31 DIAGNOSIS — R4182 Altered mental status, unspecified: Secondary | ICD-10-CM | POA: Diagnosis not present

## 2016-04-03 DIAGNOSIS — F0390 Unspecified dementia without behavioral disturbance: Secondary | ICD-10-CM | POA: Diagnosis not present

## 2016-04-03 DIAGNOSIS — R4182 Altered mental status, unspecified: Secondary | ICD-10-CM | POA: Diagnosis not present

## 2016-04-03 DIAGNOSIS — R41841 Cognitive communication deficit: Secondary | ICD-10-CM | POA: Diagnosis not present

## 2016-04-03 DIAGNOSIS — R062 Wheezing: Secondary | ICD-10-CM | POA: Diagnosis not present

## 2016-04-03 DIAGNOSIS — R262 Difficulty in walking, not elsewhere classified: Secondary | ICD-10-CM | POA: Diagnosis not present

## 2016-04-04 DIAGNOSIS — R41841 Cognitive communication deficit: Secondary | ICD-10-CM | POA: Diagnosis not present

## 2016-04-04 DIAGNOSIS — F0390 Unspecified dementia without behavioral disturbance: Secondary | ICD-10-CM | POA: Diagnosis not present

## 2016-04-04 DIAGNOSIS — R4182 Altered mental status, unspecified: Secondary | ICD-10-CM | POA: Diagnosis not present

## 2016-04-04 DIAGNOSIS — R062 Wheezing: Secondary | ICD-10-CM | POA: Diagnosis not present

## 2016-04-04 DIAGNOSIS — R262 Difficulty in walking, not elsewhere classified: Secondary | ICD-10-CM | POA: Diagnosis not present

## 2016-04-06 DIAGNOSIS — R41841 Cognitive communication deficit: Secondary | ICD-10-CM | POA: Diagnosis not present

## 2016-04-06 DIAGNOSIS — R062 Wheezing: Secondary | ICD-10-CM | POA: Diagnosis not present

## 2016-04-06 DIAGNOSIS — F0390 Unspecified dementia without behavioral disturbance: Secondary | ICD-10-CM | POA: Diagnosis not present

## 2016-04-06 DIAGNOSIS — R4182 Altered mental status, unspecified: Secondary | ICD-10-CM | POA: Diagnosis not present

## 2016-04-06 DIAGNOSIS — R262 Difficulty in walking, not elsewhere classified: Secondary | ICD-10-CM | POA: Diagnosis not present

## 2016-04-07 DIAGNOSIS — G301 Alzheimer's disease with late onset: Secondary | ICD-10-CM | POA: Diagnosis not present

## 2016-04-07 DIAGNOSIS — J45909 Unspecified asthma, uncomplicated: Secondary | ICD-10-CM | POA: Diagnosis not present

## 2016-04-07 DIAGNOSIS — M353 Polymyalgia rheumatica: Secondary | ICD-10-CM | POA: Diagnosis not present

## 2016-04-08 DIAGNOSIS — F0390 Unspecified dementia without behavioral disturbance: Secondary | ICD-10-CM | POA: Diagnosis not present

## 2016-04-08 DIAGNOSIS — R4182 Altered mental status, unspecified: Secondary | ICD-10-CM | POA: Diagnosis not present

## 2016-04-08 DIAGNOSIS — R262 Difficulty in walking, not elsewhere classified: Secondary | ICD-10-CM | POA: Diagnosis not present

## 2016-04-08 DIAGNOSIS — R062 Wheezing: Secondary | ICD-10-CM | POA: Diagnosis not present

## 2016-04-08 DIAGNOSIS — R41841 Cognitive communication deficit: Secondary | ICD-10-CM | POA: Diagnosis not present

## 2016-04-11 DIAGNOSIS — R062 Wheezing: Secondary | ICD-10-CM | POA: Diagnosis not present

## 2016-04-11 DIAGNOSIS — R41841 Cognitive communication deficit: Secondary | ICD-10-CM | POA: Diagnosis not present

## 2016-04-11 DIAGNOSIS — R4182 Altered mental status, unspecified: Secondary | ICD-10-CM | POA: Diagnosis not present

## 2016-04-11 DIAGNOSIS — R262 Difficulty in walking, not elsewhere classified: Secondary | ICD-10-CM | POA: Diagnosis not present

## 2016-04-11 DIAGNOSIS — F0390 Unspecified dementia without behavioral disturbance: Secondary | ICD-10-CM | POA: Diagnosis not present

## 2016-04-13 DIAGNOSIS — R262 Difficulty in walking, not elsewhere classified: Secondary | ICD-10-CM | POA: Diagnosis not present

## 2016-04-13 DIAGNOSIS — R4182 Altered mental status, unspecified: Secondary | ICD-10-CM | POA: Diagnosis not present

## 2016-04-13 DIAGNOSIS — R062 Wheezing: Secondary | ICD-10-CM | POA: Diagnosis not present

## 2016-04-13 DIAGNOSIS — F0390 Unspecified dementia without behavioral disturbance: Secondary | ICD-10-CM | POA: Diagnosis not present

## 2016-04-13 DIAGNOSIS — R41841 Cognitive communication deficit: Secondary | ICD-10-CM | POA: Diagnosis not present

## 2016-04-16 DIAGNOSIS — R4182 Altered mental status, unspecified: Secondary | ICD-10-CM | POA: Diagnosis not present

## 2016-04-16 DIAGNOSIS — F0390 Unspecified dementia without behavioral disturbance: Secondary | ICD-10-CM | POA: Diagnosis not present

## 2016-04-16 DIAGNOSIS — R262 Difficulty in walking, not elsewhere classified: Secondary | ICD-10-CM | POA: Diagnosis not present

## 2016-04-16 DIAGNOSIS — R062 Wheezing: Secondary | ICD-10-CM | POA: Diagnosis not present

## 2016-04-16 DIAGNOSIS — R41841 Cognitive communication deficit: Secondary | ICD-10-CM | POA: Diagnosis not present

## 2016-04-19 DIAGNOSIS — R41841 Cognitive communication deficit: Secondary | ICD-10-CM | POA: Diagnosis not present

## 2016-04-19 DIAGNOSIS — F0390 Unspecified dementia without behavioral disturbance: Secondary | ICD-10-CM | POA: Diagnosis not present

## 2016-04-19 DIAGNOSIS — R262 Difficulty in walking, not elsewhere classified: Secondary | ICD-10-CM | POA: Diagnosis not present

## 2016-04-19 DIAGNOSIS — R062 Wheezing: Secondary | ICD-10-CM | POA: Diagnosis not present

## 2016-04-19 DIAGNOSIS — R4182 Altered mental status, unspecified: Secondary | ICD-10-CM | POA: Diagnosis not present

## 2016-04-22 DIAGNOSIS — H903 Sensorineural hearing loss, bilateral: Secondary | ICD-10-CM | POA: Diagnosis not present

## 2016-05-06 DIAGNOSIS — H349 Unspecified retinal vascular occlusion: Secondary | ICD-10-CM | POA: Diagnosis not present

## 2016-05-13 DIAGNOSIS — H34812 Central retinal vein occlusion, left eye, with macular edema: Secondary | ICD-10-CM | POA: Diagnosis not present

## 2016-06-02 DIAGNOSIS — S61240A Puncture wound with foreign body of right index finger without damage to nail, initial encounter: Secondary | ICD-10-CM | POA: Diagnosis not present

## 2016-06-16 DIAGNOSIS — E559 Vitamin D deficiency, unspecified: Secondary | ICD-10-CM | POA: Diagnosis not present

## 2016-06-16 DIAGNOSIS — M1000 Idiopathic gout, unspecified site: Secondary | ICD-10-CM | POA: Diagnosis not present

## 2016-06-16 DIAGNOSIS — I509 Heart failure, unspecified: Secondary | ICD-10-CM | POA: Diagnosis not present

## 2016-06-16 DIAGNOSIS — D649 Anemia, unspecified: Secondary | ICD-10-CM | POA: Diagnosis not present

## 2016-06-17 DIAGNOSIS — F0390 Unspecified dementia without behavioral disturbance: Secondary | ICD-10-CM | POA: Diagnosis not present

## 2016-06-17 DIAGNOSIS — R4182 Altered mental status, unspecified: Secondary | ICD-10-CM | POA: Diagnosis not present

## 2016-06-17 DIAGNOSIS — N390 Urinary tract infection, site not specified: Secondary | ICD-10-CM | POA: Diagnosis not present

## 2016-06-17 DIAGNOSIS — R41841 Cognitive communication deficit: Secondary | ICD-10-CM | POA: Diagnosis not present

## 2016-06-17 DIAGNOSIS — R062 Wheezing: Secondary | ICD-10-CM | POA: Diagnosis not present

## 2016-06-17 DIAGNOSIS — H34812 Central retinal vein occlusion, left eye, with macular edema: Secondary | ICD-10-CM | POA: Diagnosis not present

## 2016-06-17 DIAGNOSIS — R319 Hematuria, unspecified: Secondary | ICD-10-CM | POA: Diagnosis not present

## 2016-06-18 DIAGNOSIS — J4540 Moderate persistent asthma, uncomplicated: Secondary | ICD-10-CM | POA: Diagnosis not present

## 2016-06-18 DIAGNOSIS — G301 Alzheimer's disease with late onset: Secondary | ICD-10-CM | POA: Diagnosis not present

## 2016-06-18 DIAGNOSIS — R41841 Cognitive communication deficit: Secondary | ICD-10-CM | POA: Diagnosis not present

## 2016-06-18 DIAGNOSIS — R4182 Altered mental status, unspecified: Secondary | ICD-10-CM | POA: Diagnosis not present

## 2016-06-18 DIAGNOSIS — F0390 Unspecified dementia without behavioral disturbance: Secondary | ICD-10-CM | POA: Diagnosis not present

## 2016-06-18 DIAGNOSIS — M109 Gout, unspecified: Secondary | ICD-10-CM | POA: Diagnosis not present

## 2016-06-18 DIAGNOSIS — R062 Wheezing: Secondary | ICD-10-CM | POA: Diagnosis not present

## 2016-06-18 DIAGNOSIS — N184 Chronic kidney disease, stage 4 (severe): Secondary | ICD-10-CM | POA: Diagnosis not present

## 2016-06-19 DIAGNOSIS — R4182 Altered mental status, unspecified: Secondary | ICD-10-CM | POA: Diagnosis not present

## 2016-06-19 DIAGNOSIS — R41841 Cognitive communication deficit: Secondary | ICD-10-CM | POA: Diagnosis not present

## 2016-06-19 DIAGNOSIS — F0390 Unspecified dementia without behavioral disturbance: Secondary | ICD-10-CM | POA: Diagnosis not present

## 2016-06-19 DIAGNOSIS — R062 Wheezing: Secondary | ICD-10-CM | POA: Diagnosis not present

## 2016-06-21 DIAGNOSIS — F0390 Unspecified dementia without behavioral disturbance: Secondary | ICD-10-CM | POA: Diagnosis not present

## 2016-06-21 DIAGNOSIS — R062 Wheezing: Secondary | ICD-10-CM | POA: Diagnosis not present

## 2016-06-21 DIAGNOSIS — R41841 Cognitive communication deficit: Secondary | ICD-10-CM | POA: Diagnosis not present

## 2016-06-21 DIAGNOSIS — R4182 Altered mental status, unspecified: Secondary | ICD-10-CM | POA: Diagnosis not present

## 2016-06-22 DIAGNOSIS — R41841 Cognitive communication deficit: Secondary | ICD-10-CM | POA: Diagnosis not present

## 2016-06-22 DIAGNOSIS — R062 Wheezing: Secondary | ICD-10-CM | POA: Diagnosis not present

## 2016-06-22 DIAGNOSIS — F0390 Unspecified dementia without behavioral disturbance: Secondary | ICD-10-CM | POA: Diagnosis not present

## 2016-06-22 DIAGNOSIS — R4182 Altered mental status, unspecified: Secondary | ICD-10-CM | POA: Diagnosis not present

## 2016-06-23 DIAGNOSIS — R41841 Cognitive communication deficit: Secondary | ICD-10-CM | POA: Diagnosis not present

## 2016-06-23 DIAGNOSIS — F0390 Unspecified dementia without behavioral disturbance: Secondary | ICD-10-CM | POA: Diagnosis not present

## 2016-06-23 DIAGNOSIS — R062 Wheezing: Secondary | ICD-10-CM | POA: Diagnosis not present

## 2016-06-23 DIAGNOSIS — R4182 Altered mental status, unspecified: Secondary | ICD-10-CM | POA: Diagnosis not present

## 2016-06-24 DIAGNOSIS — R062 Wheezing: Secondary | ICD-10-CM | POA: Diagnosis not present

## 2016-06-24 DIAGNOSIS — R41841 Cognitive communication deficit: Secondary | ICD-10-CM | POA: Diagnosis not present

## 2016-06-24 DIAGNOSIS — R4182 Altered mental status, unspecified: Secondary | ICD-10-CM | POA: Diagnosis not present

## 2016-06-24 DIAGNOSIS — F0390 Unspecified dementia without behavioral disturbance: Secondary | ICD-10-CM | POA: Diagnosis not present

## 2016-06-25 DIAGNOSIS — F0390 Unspecified dementia without behavioral disturbance: Secondary | ICD-10-CM | POA: Diagnosis not present

## 2016-06-25 DIAGNOSIS — R062 Wheezing: Secondary | ICD-10-CM | POA: Diagnosis not present

## 2016-06-25 DIAGNOSIS — R4182 Altered mental status, unspecified: Secondary | ICD-10-CM | POA: Diagnosis not present

## 2016-06-25 DIAGNOSIS — R41841 Cognitive communication deficit: Secondary | ICD-10-CM | POA: Diagnosis not present

## 2016-06-26 DIAGNOSIS — R4182 Altered mental status, unspecified: Secondary | ICD-10-CM | POA: Diagnosis not present

## 2016-06-26 DIAGNOSIS — F0390 Unspecified dementia without behavioral disturbance: Secondary | ICD-10-CM | POA: Diagnosis not present

## 2016-06-26 DIAGNOSIS — R062 Wheezing: Secondary | ICD-10-CM | POA: Diagnosis not present

## 2016-06-26 DIAGNOSIS — R41841 Cognitive communication deficit: Secondary | ICD-10-CM | POA: Diagnosis not present

## 2016-06-29 DIAGNOSIS — R41841 Cognitive communication deficit: Secondary | ICD-10-CM | POA: Diagnosis not present

## 2016-06-29 DIAGNOSIS — R4182 Altered mental status, unspecified: Secondary | ICD-10-CM | POA: Diagnosis not present

## 2016-06-29 DIAGNOSIS — F0390 Unspecified dementia without behavioral disturbance: Secondary | ICD-10-CM | POA: Diagnosis not present

## 2016-06-29 DIAGNOSIS — R062 Wheezing: Secondary | ICD-10-CM | POA: Diagnosis not present

## 2016-06-30 DIAGNOSIS — R062 Wheezing: Secondary | ICD-10-CM | POA: Diagnosis not present

## 2016-06-30 DIAGNOSIS — R4182 Altered mental status, unspecified: Secondary | ICD-10-CM | POA: Diagnosis not present

## 2016-06-30 DIAGNOSIS — R41841 Cognitive communication deficit: Secondary | ICD-10-CM | POA: Diagnosis not present

## 2016-06-30 DIAGNOSIS — F0390 Unspecified dementia without behavioral disturbance: Secondary | ICD-10-CM | POA: Diagnosis not present

## 2016-07-01 DIAGNOSIS — R41841 Cognitive communication deficit: Secondary | ICD-10-CM | POA: Diagnosis not present

## 2016-07-01 DIAGNOSIS — F0390 Unspecified dementia without behavioral disturbance: Secondary | ICD-10-CM | POA: Diagnosis not present

## 2016-07-01 DIAGNOSIS — R062 Wheezing: Secondary | ICD-10-CM | POA: Diagnosis not present

## 2016-07-01 DIAGNOSIS — R4182 Altered mental status, unspecified: Secondary | ICD-10-CM | POA: Diagnosis not present

## 2016-07-02 DIAGNOSIS — R062 Wheezing: Secondary | ICD-10-CM | POA: Diagnosis not present

## 2016-07-02 DIAGNOSIS — F0390 Unspecified dementia without behavioral disturbance: Secondary | ICD-10-CM | POA: Diagnosis not present

## 2016-07-02 DIAGNOSIS — R41841 Cognitive communication deficit: Secondary | ICD-10-CM | POA: Diagnosis not present

## 2016-07-02 DIAGNOSIS — R4182 Altered mental status, unspecified: Secondary | ICD-10-CM | POA: Diagnosis not present

## 2016-07-03 DIAGNOSIS — R4182 Altered mental status, unspecified: Secondary | ICD-10-CM | POA: Diagnosis not present

## 2016-07-03 DIAGNOSIS — F0390 Unspecified dementia without behavioral disturbance: Secondary | ICD-10-CM | POA: Diagnosis not present

## 2016-07-03 DIAGNOSIS — R41841 Cognitive communication deficit: Secondary | ICD-10-CM | POA: Diagnosis not present

## 2016-07-03 DIAGNOSIS — R062 Wheezing: Secondary | ICD-10-CM | POA: Diagnosis not present

## 2016-07-06 DIAGNOSIS — R062 Wheezing: Secondary | ICD-10-CM | POA: Diagnosis not present

## 2016-07-06 DIAGNOSIS — R4182 Altered mental status, unspecified: Secondary | ICD-10-CM | POA: Diagnosis not present

## 2016-07-06 DIAGNOSIS — R41841 Cognitive communication deficit: Secondary | ICD-10-CM | POA: Diagnosis not present

## 2016-07-06 DIAGNOSIS — F0390 Unspecified dementia without behavioral disturbance: Secondary | ICD-10-CM | POA: Diagnosis not present

## 2016-07-07 DIAGNOSIS — F0390 Unspecified dementia without behavioral disturbance: Secondary | ICD-10-CM | POA: Diagnosis not present

## 2016-07-07 DIAGNOSIS — R41841 Cognitive communication deficit: Secondary | ICD-10-CM | POA: Diagnosis not present

## 2016-07-07 DIAGNOSIS — R062 Wheezing: Secondary | ICD-10-CM | POA: Diagnosis not present

## 2016-07-07 DIAGNOSIS — R4182 Altered mental status, unspecified: Secondary | ICD-10-CM | POA: Diagnosis not present

## 2016-07-08 DIAGNOSIS — R062 Wheezing: Secondary | ICD-10-CM | POA: Diagnosis not present

## 2016-07-08 DIAGNOSIS — R41841 Cognitive communication deficit: Secondary | ICD-10-CM | POA: Diagnosis not present

## 2016-07-08 DIAGNOSIS — F0390 Unspecified dementia without behavioral disturbance: Secondary | ICD-10-CM | POA: Diagnosis not present

## 2016-07-08 DIAGNOSIS — R4182 Altered mental status, unspecified: Secondary | ICD-10-CM | POA: Diagnosis not present

## 2016-07-09 DIAGNOSIS — R41841 Cognitive communication deficit: Secondary | ICD-10-CM | POA: Diagnosis not present

## 2016-07-09 DIAGNOSIS — R062 Wheezing: Secondary | ICD-10-CM | POA: Diagnosis not present

## 2016-07-09 DIAGNOSIS — F0390 Unspecified dementia without behavioral disturbance: Secondary | ICD-10-CM | POA: Diagnosis not present

## 2016-07-09 DIAGNOSIS — R4182 Altered mental status, unspecified: Secondary | ICD-10-CM | POA: Diagnosis not present

## 2016-07-10 DIAGNOSIS — R41841 Cognitive communication deficit: Secondary | ICD-10-CM | POA: Diagnosis not present

## 2016-07-10 DIAGNOSIS — R4182 Altered mental status, unspecified: Secondary | ICD-10-CM | POA: Diagnosis not present

## 2016-07-10 DIAGNOSIS — F0390 Unspecified dementia without behavioral disturbance: Secondary | ICD-10-CM | POA: Diagnosis not present

## 2016-07-10 DIAGNOSIS — R062 Wheezing: Secondary | ICD-10-CM | POA: Diagnosis not present

## 2016-07-13 DIAGNOSIS — R4182 Altered mental status, unspecified: Secondary | ICD-10-CM | POA: Diagnosis not present

## 2016-07-13 DIAGNOSIS — R062 Wheezing: Secondary | ICD-10-CM | POA: Diagnosis not present

## 2016-07-13 DIAGNOSIS — F0390 Unspecified dementia without behavioral disturbance: Secondary | ICD-10-CM | POA: Diagnosis not present

## 2016-07-13 DIAGNOSIS — R41841 Cognitive communication deficit: Secondary | ICD-10-CM | POA: Diagnosis not present

## 2016-07-14 DIAGNOSIS — R41841 Cognitive communication deficit: Secondary | ICD-10-CM | POA: Diagnosis not present

## 2016-07-14 DIAGNOSIS — F0390 Unspecified dementia without behavioral disturbance: Secondary | ICD-10-CM | POA: Diagnosis not present

## 2016-07-14 DIAGNOSIS — R062 Wheezing: Secondary | ICD-10-CM | POA: Diagnosis not present

## 2016-07-14 DIAGNOSIS — R4182 Altered mental status, unspecified: Secondary | ICD-10-CM | POA: Diagnosis not present

## 2016-07-15 DIAGNOSIS — F0390 Unspecified dementia without behavioral disturbance: Secondary | ICD-10-CM | POA: Diagnosis not present

## 2016-07-15 DIAGNOSIS — R062 Wheezing: Secondary | ICD-10-CM | POA: Diagnosis not present

## 2016-07-15 DIAGNOSIS — R41841 Cognitive communication deficit: Secondary | ICD-10-CM | POA: Diagnosis not present

## 2016-07-15 DIAGNOSIS — R4182 Altered mental status, unspecified: Secondary | ICD-10-CM | POA: Diagnosis not present

## 2016-07-16 DIAGNOSIS — R4182 Altered mental status, unspecified: Secondary | ICD-10-CM | POA: Diagnosis not present

## 2016-07-16 DIAGNOSIS — R41841 Cognitive communication deficit: Secondary | ICD-10-CM | POA: Diagnosis not present

## 2016-07-16 DIAGNOSIS — F0390 Unspecified dementia without behavioral disturbance: Secondary | ICD-10-CM | POA: Diagnosis not present

## 2016-07-16 DIAGNOSIS — R062 Wheezing: Secondary | ICD-10-CM | POA: Diagnosis not present

## 2016-07-17 DIAGNOSIS — R4182 Altered mental status, unspecified: Secondary | ICD-10-CM | POA: Diagnosis not present

## 2016-07-17 DIAGNOSIS — R062 Wheezing: Secondary | ICD-10-CM | POA: Diagnosis not present

## 2016-07-17 DIAGNOSIS — R41841 Cognitive communication deficit: Secondary | ICD-10-CM | POA: Diagnosis not present

## 2016-07-17 DIAGNOSIS — F0390 Unspecified dementia without behavioral disturbance: Secondary | ICD-10-CM | POA: Diagnosis not present

## 2016-07-20 DIAGNOSIS — R41841 Cognitive communication deficit: Secondary | ICD-10-CM | POA: Diagnosis not present

## 2016-07-20 DIAGNOSIS — F0390 Unspecified dementia without behavioral disturbance: Secondary | ICD-10-CM | POA: Diagnosis not present

## 2016-07-20 DIAGNOSIS — R062 Wheezing: Secondary | ICD-10-CM | POA: Diagnosis not present

## 2016-07-20 DIAGNOSIS — R4182 Altered mental status, unspecified: Secondary | ICD-10-CM | POA: Diagnosis not present

## 2016-07-21 DIAGNOSIS — H34812 Central retinal vein occlusion, left eye, with macular edema: Secondary | ICD-10-CM | POA: Diagnosis not present

## 2016-08-13 DIAGNOSIS — R4182 Altered mental status, unspecified: Secondary | ICD-10-CM | POA: Diagnosis not present

## 2016-08-13 DIAGNOSIS — D649 Anemia, unspecified: Secondary | ICD-10-CM | POA: Diagnosis not present

## 2016-08-16 DIAGNOSIS — J45909 Unspecified asthma, uncomplicated: Secondary | ICD-10-CM | POA: Diagnosis not present

## 2016-08-16 DIAGNOSIS — G301 Alzheimer's disease with late onset: Secondary | ICD-10-CM | POA: Diagnosis not present

## 2016-08-16 DIAGNOSIS — M353 Polymyalgia rheumatica: Secondary | ICD-10-CM | POA: Diagnosis not present

## 2016-09-15 DIAGNOSIS — H34812 Central retinal vein occlusion, left eye, with macular edema: Secondary | ICD-10-CM | POA: Diagnosis not present

## 2016-09-15 DIAGNOSIS — H43813 Vitreous degeneration, bilateral: Secondary | ICD-10-CM | POA: Diagnosis not present

## 2016-09-17 DIAGNOSIS — H903 Sensorineural hearing loss, bilateral: Secondary | ICD-10-CM | POA: Diagnosis not present

## 2016-10-06 DIAGNOSIS — G301 Alzheimer's disease with late onset: Secondary | ICD-10-CM | POA: Diagnosis not present

## 2016-10-06 DIAGNOSIS — J45909 Unspecified asthma, uncomplicated: Secondary | ICD-10-CM | POA: Diagnosis not present

## 2016-10-13 DIAGNOSIS — B351 Tinea unguium: Secondary | ICD-10-CM | POA: Diagnosis not present

## 2016-10-13 DIAGNOSIS — R05 Cough: Secondary | ICD-10-CM | POA: Diagnosis not present

## 2016-10-27 DIAGNOSIS — H34812 Central retinal vein occlusion, left eye, with macular edema: Secondary | ICD-10-CM | POA: Diagnosis not present

## 2016-11-10 ENCOUNTER — Ambulatory Visit: Payer: Medicare Other | Admitting: Sports Medicine

## 2016-11-12 DIAGNOSIS — J45998 Other asthma: Secondary | ICD-10-CM | POA: Diagnosis not present

## 2016-11-12 DIAGNOSIS — R05 Cough: Secondary | ICD-10-CM | POA: Diagnosis not present

## 2016-11-12 DIAGNOSIS — M1000 Idiopathic gout, unspecified site: Secondary | ICD-10-CM | POA: Diagnosis not present

## 2016-11-12 DIAGNOSIS — M6281 Muscle weakness (generalized): Secondary | ICD-10-CM | POA: Diagnosis not present

## 2016-11-12 DIAGNOSIS — F0390 Unspecified dementia without behavioral disturbance: Secondary | ICD-10-CM | POA: Diagnosis not present

## 2016-11-12 DIAGNOSIS — R52 Pain, unspecified: Secondary | ICD-10-CM | POA: Diagnosis not present

## 2016-11-12 DIAGNOSIS — R197 Diarrhea, unspecified: Secondary | ICD-10-CM | POA: Diagnosis not present

## 2016-11-12 DIAGNOSIS — E569 Vitamin deficiency, unspecified: Secondary | ICD-10-CM | POA: Diagnosis not present

## 2016-11-19 DIAGNOSIS — R41841 Cognitive communication deficit: Secondary | ICD-10-CM | POA: Diagnosis not present

## 2016-11-19 DIAGNOSIS — H900 Conductive hearing loss, bilateral: Secondary | ICD-10-CM | POA: Diagnosis not present

## 2016-11-19 DIAGNOSIS — H93293 Other abnormal auditory perceptions, bilateral: Secondary | ICD-10-CM | POA: Diagnosis not present

## 2016-11-20 ENCOUNTER — Ambulatory Visit (INDEPENDENT_AMBULATORY_CARE_PROVIDER_SITE_OTHER): Payer: Medicare Other | Admitting: Podiatry

## 2016-11-20 ENCOUNTER — Encounter: Payer: Self-pay | Admitting: Podiatry

## 2016-11-20 DIAGNOSIS — H900 Conductive hearing loss, bilateral: Secondary | ICD-10-CM | POA: Diagnosis not present

## 2016-11-20 DIAGNOSIS — M79676 Pain in unspecified toe(s): Secondary | ICD-10-CM | POA: Diagnosis not present

## 2016-11-20 DIAGNOSIS — B351 Tinea unguium: Secondary | ICD-10-CM

## 2016-11-20 DIAGNOSIS — H93293 Other abnormal auditory perceptions, bilateral: Secondary | ICD-10-CM | POA: Diagnosis not present

## 2016-11-20 DIAGNOSIS — R41841 Cognitive communication deficit: Secondary | ICD-10-CM | POA: Diagnosis not present

## 2016-11-20 NOTE — Progress Notes (Signed)
Patient ID: Michele Maxwell, female   DOB: [birth year], 83 y.o.   MRN: [id number]  Subjective: 83 y.o. returns the office today for painful, elongated, thickened toenails which she cannot trim herself. Denies any redness or drainage around the nails. Denies any acute changes since last appointment and no new complaints today. Denies any systemic complaints such as fevers, chills, nausea, vomiting.   Objective: AAO 3, NAD DP/PT pulses palpable, CRT less than 3 seconds Nails hypertrophic, dystrophic, elongated, brittle, discolored 10. There is tenderness overlying the nails 1-5 bilaterally. There is no surrounding erythema or drainage along the nail sites. No open lesions or pre-ulcerative lesions are identified. No other areas of tenderness bilateral lower extremities. No overlying edema, erythema, increased warmth. No pain with calf compression, swelling, warmth, erythema.  Assessment: Patient presents with symptomatic onychomycosis  Plan: -Treatment options including alternatives, risks, complications were discussed -Nails sharply debrided 10 without complication/bleeding. -Discussed daily foot inspection. If there are any changes, to call the office immediately.  -Follow-up in 3 months or sooner if any problems are to arise. In the meantime, encouraged to call the office with any questions, concerns, changes symptoms.  Ovid Curd, DPM

## 2016-11-23 DIAGNOSIS — R41841 Cognitive communication deficit: Secondary | ICD-10-CM | POA: Diagnosis not present

## 2016-11-23 DIAGNOSIS — H93293 Other abnormal auditory perceptions, bilateral: Secondary | ICD-10-CM | POA: Diagnosis not present

## 2016-11-23 DIAGNOSIS — H900 Conductive hearing loss, bilateral: Secondary | ICD-10-CM | POA: Diagnosis not present

## 2016-11-24 DIAGNOSIS — H900 Conductive hearing loss, bilateral: Secondary | ICD-10-CM | POA: Diagnosis not present

## 2016-11-24 DIAGNOSIS — H93293 Other abnormal auditory perceptions, bilateral: Secondary | ICD-10-CM | POA: Diagnosis not present

## 2016-11-24 DIAGNOSIS — R41841 Cognitive communication deficit: Secondary | ICD-10-CM | POA: Diagnosis not present

## 2016-11-26 DIAGNOSIS — R41841 Cognitive communication deficit: Secondary | ICD-10-CM | POA: Diagnosis not present

## 2016-11-26 DIAGNOSIS — H900 Conductive hearing loss, bilateral: Secondary | ICD-10-CM | POA: Diagnosis not present

## 2016-11-26 DIAGNOSIS — R05 Cough: Secondary | ICD-10-CM | POA: Diagnosis not present

## 2016-11-26 DIAGNOSIS — H93293 Other abnormal auditory perceptions, bilateral: Secondary | ICD-10-CM | POA: Diagnosis not present

## 2016-11-27 DIAGNOSIS — R41841 Cognitive communication deficit: Secondary | ICD-10-CM | POA: Diagnosis not present

## 2016-11-27 DIAGNOSIS — H900 Conductive hearing loss, bilateral: Secondary | ICD-10-CM | POA: Diagnosis not present

## 2016-11-27 DIAGNOSIS — H93293 Other abnormal auditory perceptions, bilateral: Secondary | ICD-10-CM | POA: Diagnosis not present

## 2016-11-30 DIAGNOSIS — H93293 Other abnormal auditory perceptions, bilateral: Secondary | ICD-10-CM | POA: Diagnosis not present

## 2016-11-30 DIAGNOSIS — B354 Tinea corporis: Secondary | ICD-10-CM | POA: Diagnosis not present

## 2016-11-30 DIAGNOSIS — R41841 Cognitive communication deficit: Secondary | ICD-10-CM | POA: Diagnosis not present

## 2016-11-30 DIAGNOSIS — H900 Conductive hearing loss, bilateral: Secondary | ICD-10-CM | POA: Diagnosis not present

## 2016-11-30 DIAGNOSIS — R52 Pain, unspecified: Secondary | ICD-10-CM | POA: Diagnosis not present

## 2016-12-01 DIAGNOSIS — H93293 Other abnormal auditory perceptions, bilateral: Secondary | ICD-10-CM | POA: Diagnosis not present

## 2016-12-01 DIAGNOSIS — R41841 Cognitive communication deficit: Secondary | ICD-10-CM | POA: Diagnosis not present

## 2016-12-01 DIAGNOSIS — H900 Conductive hearing loss, bilateral: Secondary | ICD-10-CM | POA: Diagnosis not present

## 2016-12-02 DIAGNOSIS — H900 Conductive hearing loss, bilateral: Secondary | ICD-10-CM | POA: Diagnosis not present

## 2016-12-02 DIAGNOSIS — H93293 Other abnormal auditory perceptions, bilateral: Secondary | ICD-10-CM | POA: Diagnosis not present

## 2016-12-02 DIAGNOSIS — R41841 Cognitive communication deficit: Secondary | ICD-10-CM | POA: Diagnosis not present

## 2016-12-04 DIAGNOSIS — H93293 Other abnormal auditory perceptions, bilateral: Secondary | ICD-10-CM | POA: Diagnosis not present

## 2016-12-04 DIAGNOSIS — R41841 Cognitive communication deficit: Secondary | ICD-10-CM | POA: Diagnosis not present

## 2016-12-04 DIAGNOSIS — H900 Conductive hearing loss, bilateral: Secondary | ICD-10-CM | POA: Diagnosis not present

## 2016-12-07 DIAGNOSIS — H93293 Other abnormal auditory perceptions, bilateral: Secondary | ICD-10-CM | POA: Diagnosis not present

## 2016-12-07 DIAGNOSIS — R41841 Cognitive communication deficit: Secondary | ICD-10-CM | POA: Diagnosis not present

## 2016-12-07 DIAGNOSIS — H900 Conductive hearing loss, bilateral: Secondary | ICD-10-CM | POA: Diagnosis not present

## 2016-12-08 DIAGNOSIS — H900 Conductive hearing loss, bilateral: Secondary | ICD-10-CM | POA: Diagnosis not present

## 2016-12-08 DIAGNOSIS — H93293 Other abnormal auditory perceptions, bilateral: Secondary | ICD-10-CM | POA: Diagnosis not present

## 2016-12-08 DIAGNOSIS — R41841 Cognitive communication deficit: Secondary | ICD-10-CM | POA: Diagnosis not present

## 2016-12-09 DIAGNOSIS — H93293 Other abnormal auditory perceptions, bilateral: Secondary | ICD-10-CM | POA: Diagnosis not present

## 2016-12-09 DIAGNOSIS — H900 Conductive hearing loss, bilateral: Secondary | ICD-10-CM | POA: Diagnosis not present

## 2016-12-09 DIAGNOSIS — R41841 Cognitive communication deficit: Secondary | ICD-10-CM | POA: Diagnosis not present

## 2016-12-10 DIAGNOSIS — R41841 Cognitive communication deficit: Secondary | ICD-10-CM | POA: Diagnosis not present

## 2016-12-10 DIAGNOSIS — H93293 Other abnormal auditory perceptions, bilateral: Secondary | ICD-10-CM | POA: Diagnosis not present

## 2016-12-10 DIAGNOSIS — H900 Conductive hearing loss, bilateral: Secondary | ICD-10-CM | POA: Diagnosis not present

## 2016-12-11 DIAGNOSIS — R41841 Cognitive communication deficit: Secondary | ICD-10-CM | POA: Diagnosis not present

## 2016-12-11 DIAGNOSIS — H93293 Other abnormal auditory perceptions, bilateral: Secondary | ICD-10-CM | POA: Diagnosis not present

## 2016-12-11 DIAGNOSIS — H900 Conductive hearing loss, bilateral: Secondary | ICD-10-CM | POA: Diagnosis not present

## 2016-12-14 DIAGNOSIS — R41841 Cognitive communication deficit: Secondary | ICD-10-CM | POA: Diagnosis not present

## 2016-12-14 DIAGNOSIS — H93293 Other abnormal auditory perceptions, bilateral: Secondary | ICD-10-CM | POA: Diagnosis not present

## 2016-12-14 DIAGNOSIS — H900 Conductive hearing loss, bilateral: Secondary | ICD-10-CM | POA: Diagnosis not present

## 2016-12-15 DIAGNOSIS — H93293 Other abnormal auditory perceptions, bilateral: Secondary | ICD-10-CM | POA: Diagnosis not present

## 2016-12-15 DIAGNOSIS — H900 Conductive hearing loss, bilateral: Secondary | ICD-10-CM | POA: Diagnosis not present

## 2016-12-15 DIAGNOSIS — R41841 Cognitive communication deficit: Secondary | ICD-10-CM | POA: Diagnosis not present

## 2016-12-16 DIAGNOSIS — H900 Conductive hearing loss, bilateral: Secondary | ICD-10-CM | POA: Diagnosis not present

## 2016-12-16 DIAGNOSIS — H93293 Other abnormal auditory perceptions, bilateral: Secondary | ICD-10-CM | POA: Diagnosis not present

## 2016-12-16 DIAGNOSIS — R41841 Cognitive communication deficit: Secondary | ICD-10-CM | POA: Diagnosis not present

## 2016-12-17 DIAGNOSIS — B354 Tinea corporis: Secondary | ICD-10-CM | POA: Diagnosis not present

## 2016-12-17 DIAGNOSIS — R52 Pain, unspecified: Secondary | ICD-10-CM | POA: Diagnosis not present

## 2016-12-17 DIAGNOSIS — L989 Disorder of the skin and subcutaneous tissue, unspecified: Secondary | ICD-10-CM | POA: Diagnosis not present

## 2016-12-22 DIAGNOSIS — H348122 Central retinal vein occlusion, left eye, stable: Secondary | ICD-10-CM | POA: Diagnosis not present

## 2016-12-30 DIAGNOSIS — M6281 Muscle weakness (generalized): Secondary | ICD-10-CM | POA: Diagnosis not present

## 2016-12-30 DIAGNOSIS — J45998 Other asthma: Secondary | ICD-10-CM | POA: Diagnosis not present

## 2016-12-30 DIAGNOSIS — E569 Vitamin deficiency, unspecified: Secondary | ICD-10-CM | POA: Diagnosis not present

## 2016-12-30 DIAGNOSIS — F0390 Unspecified dementia without behavioral disturbance: Secondary | ICD-10-CM | POA: Diagnosis not present

## 2016-12-30 DIAGNOSIS — R197 Diarrhea, unspecified: Secondary | ICD-10-CM | POA: Diagnosis not present

## 2016-12-30 DIAGNOSIS — M1000 Idiopathic gout, unspecified site: Secondary | ICD-10-CM | POA: Diagnosis not present

## 2016-12-30 DIAGNOSIS — J209 Acute bronchitis, unspecified: Secondary | ICD-10-CM | POA: Diagnosis not present

## 2016-12-30 DIAGNOSIS — R52 Pain, unspecified: Secondary | ICD-10-CM | POA: Diagnosis not present

## 2016-12-30 DIAGNOSIS — R05 Cough: Secondary | ICD-10-CM | POA: Diagnosis not present

## 2016-12-31 DIAGNOSIS — M6281 Muscle weakness (generalized): Secondary | ICD-10-CM | POA: Diagnosis not present

## 2016-12-31 DIAGNOSIS — F0390 Unspecified dementia without behavioral disturbance: Secondary | ICD-10-CM | POA: Diagnosis not present

## 2016-12-31 DIAGNOSIS — G309 Alzheimer's disease, unspecified: Secondary | ICD-10-CM | POA: Diagnosis not present

## 2017-01-04 DIAGNOSIS — K5900 Constipation, unspecified: Secondary | ICD-10-CM | POA: Diagnosis not present

## 2017-01-04 DIAGNOSIS — K649 Unspecified hemorrhoids: Secondary | ICD-10-CM | POA: Diagnosis not present

## 2017-01-14 DIAGNOSIS — K5900 Constipation, unspecified: Secondary | ICD-10-CM | POA: Diagnosis not present

## 2017-01-14 DIAGNOSIS — K649 Unspecified hemorrhoids: Secondary | ICD-10-CM | POA: Diagnosis not present

## 2017-01-15 ENCOUNTER — Ambulatory Visit: Payer: Medicare Other | Admitting: Podiatry

## 2017-01-18 DIAGNOSIS — R05 Cough: Secondary | ICD-10-CM | POA: Diagnosis not present

## 2017-01-18 DIAGNOSIS — J209 Acute bronchitis, unspecified: Secondary | ICD-10-CM | POA: Diagnosis not present

## 2017-01-18 DIAGNOSIS — F0390 Unspecified dementia without behavioral disturbance: Secondary | ICD-10-CM | POA: Diagnosis not present

## 2017-01-18 DIAGNOSIS — R52 Pain, unspecified: Secondary | ICD-10-CM | POA: Diagnosis not present

## 2017-01-18 DIAGNOSIS — M6281 Muscle weakness (generalized): Secondary | ICD-10-CM | POA: Diagnosis not present

## 2017-01-18 DIAGNOSIS — J45998 Other asthma: Secondary | ICD-10-CM | POA: Diagnosis not present

## 2017-01-18 DIAGNOSIS — E569 Vitamin deficiency, unspecified: Secondary | ICD-10-CM | POA: Diagnosis not present

## 2017-01-18 DIAGNOSIS — R197 Diarrhea, unspecified: Secondary | ICD-10-CM | POA: Diagnosis not present

## 2017-01-18 DIAGNOSIS — M1000 Idiopathic gout, unspecified site: Secondary | ICD-10-CM | POA: Diagnosis not present

## 2017-01-19 DIAGNOSIS — Z79899 Other long term (current) drug therapy: Secondary | ICD-10-CM | POA: Diagnosis not present

## 2017-01-19 DIAGNOSIS — D649 Anemia, unspecified: Secondary | ICD-10-CM | POA: Diagnosis not present

## 2017-01-19 DIAGNOSIS — E785 Hyperlipidemia, unspecified: Secondary | ICD-10-CM | POA: Diagnosis not present

## 2017-01-25 DIAGNOSIS — R52 Pain, unspecified: Secondary | ICD-10-CM | POA: Diagnosis not present

## 2017-01-25 DIAGNOSIS — J45998 Other asthma: Secondary | ICD-10-CM | POA: Diagnosis not present

## 2017-01-25 DIAGNOSIS — E569 Vitamin deficiency, unspecified: Secondary | ICD-10-CM | POA: Diagnosis not present

## 2017-01-25 DIAGNOSIS — M6281 Muscle weakness (generalized): Secondary | ICD-10-CM | POA: Diagnosis not present

## 2017-01-25 DIAGNOSIS — M1000 Idiopathic gout, unspecified site: Secondary | ICD-10-CM | POA: Diagnosis not present

## 2017-01-25 DIAGNOSIS — R197 Diarrhea, unspecified: Secondary | ICD-10-CM | POA: Diagnosis not present

## 2017-01-25 DIAGNOSIS — F0390 Unspecified dementia without behavioral disturbance: Secondary | ICD-10-CM | POA: Diagnosis not present

## 2017-03-01 DIAGNOSIS — H43813 Vitreous degeneration, bilateral: Secondary | ICD-10-CM | POA: Diagnosis not present

## 2017-03-01 DIAGNOSIS — H18413 Arcus senilis, bilateral: Secondary | ICD-10-CM | POA: Diagnosis not present

## 2017-03-01 DIAGNOSIS — Z961 Presence of intraocular lens: Secondary | ICD-10-CM | POA: Diagnosis not present

## 2017-03-01 DIAGNOSIS — H35033 Hypertensive retinopathy, bilateral: Secondary | ICD-10-CM | POA: Diagnosis not present

## 2017-03-01 DIAGNOSIS — H353131 Nonexudative age-related macular degeneration, bilateral, early dry stage: Secondary | ICD-10-CM | POA: Diagnosis not present

## 2017-03-05 DIAGNOSIS — E569 Vitamin deficiency, unspecified: Secondary | ICD-10-CM | POA: Diagnosis not present

## 2017-03-05 DIAGNOSIS — M1000 Idiopathic gout, unspecified site: Secondary | ICD-10-CM | POA: Diagnosis not present

## 2017-03-05 DIAGNOSIS — M6281 Muscle weakness (generalized): Secondary | ICD-10-CM | POA: Diagnosis not present

## 2017-03-05 DIAGNOSIS — J45998 Other asthma: Secondary | ICD-10-CM | POA: Diagnosis not present

## 2017-03-05 DIAGNOSIS — J209 Acute bronchitis, unspecified: Secondary | ICD-10-CM | POA: Diagnosis not present

## 2017-03-05 DIAGNOSIS — R197 Diarrhea, unspecified: Secondary | ICD-10-CM | POA: Diagnosis not present

## 2017-03-05 DIAGNOSIS — R05 Cough: Secondary | ICD-10-CM | POA: Diagnosis not present

## 2017-03-05 DIAGNOSIS — F0390 Unspecified dementia without behavioral disturbance: Secondary | ICD-10-CM | POA: Diagnosis not present

## 2017-03-05 DIAGNOSIS — R52 Pain, unspecified: Secondary | ICD-10-CM | POA: Diagnosis not present

## 2017-03-09 DIAGNOSIS — H348122 Central retinal vein occlusion, left eye, stable: Secondary | ICD-10-CM | POA: Diagnosis not present

## 2017-03-09 DIAGNOSIS — H43813 Vitreous degeneration, bilateral: Secondary | ICD-10-CM | POA: Diagnosis not present

## 2017-03-17 DIAGNOSIS — F0280 Dementia in other diseases classified elsewhere without behavioral disturbance: Secondary | ICD-10-CM | POA: Diagnosis not present

## 2017-03-17 DIAGNOSIS — G301 Alzheimer's disease with late onset: Secondary | ICD-10-CM | POA: Diagnosis not present

## 2017-03-30 DIAGNOSIS — R52 Pain, unspecified: Secondary | ICD-10-CM | POA: Diagnosis not present

## 2017-03-30 DIAGNOSIS — J45998 Other asthma: Secondary | ICD-10-CM | POA: Diagnosis not present

## 2017-03-30 DIAGNOSIS — M6281 Muscle weakness (generalized): Secondary | ICD-10-CM | POA: Diagnosis not present

## 2017-03-30 DIAGNOSIS — R197 Diarrhea, unspecified: Secondary | ICD-10-CM | POA: Diagnosis not present

## 2017-03-30 DIAGNOSIS — F0390 Unspecified dementia without behavioral disturbance: Secondary | ICD-10-CM | POA: Diagnosis not present

## 2017-03-30 DIAGNOSIS — M1000 Idiopathic gout, unspecified site: Secondary | ICD-10-CM | POA: Diagnosis not present

## 2017-03-30 DIAGNOSIS — E569 Vitamin deficiency, unspecified: Secondary | ICD-10-CM | POA: Diagnosis not present

## 2017-04-08 DIAGNOSIS — R197 Diarrhea, unspecified: Secondary | ICD-10-CM | POA: Diagnosis not present

## 2017-04-08 DIAGNOSIS — M1000 Idiopathic gout, unspecified site: Secondary | ICD-10-CM | POA: Diagnosis not present

## 2017-04-08 DIAGNOSIS — E569 Vitamin deficiency, unspecified: Secondary | ICD-10-CM | POA: Diagnosis not present

## 2017-04-08 DIAGNOSIS — M6281 Muscle weakness (generalized): Secondary | ICD-10-CM | POA: Diagnosis not present

## 2017-04-08 DIAGNOSIS — K5900 Constipation, unspecified: Secondary | ICD-10-CM | POA: Diagnosis not present

## 2017-04-08 DIAGNOSIS — R52 Pain, unspecified: Secondary | ICD-10-CM | POA: Diagnosis not present

## 2017-04-08 DIAGNOSIS — F0390 Unspecified dementia without behavioral disturbance: Secondary | ICD-10-CM | POA: Diagnosis not present

## 2017-04-08 DIAGNOSIS — J45998 Other asthma: Secondary | ICD-10-CM | POA: Diagnosis not present

## 2017-04-13 DIAGNOSIS — M1000 Idiopathic gout, unspecified site: Secondary | ICD-10-CM | POA: Diagnosis not present

## 2017-04-13 DIAGNOSIS — R52 Pain, unspecified: Secondary | ICD-10-CM | POA: Diagnosis not present

## 2017-04-13 DIAGNOSIS — R197 Diarrhea, unspecified: Secondary | ICD-10-CM | POA: Diagnosis not present

## 2017-04-13 DIAGNOSIS — N189 Chronic kidney disease, unspecified: Secondary | ICD-10-CM | POA: Diagnosis not present

## 2017-04-13 DIAGNOSIS — K5900 Constipation, unspecified: Secondary | ICD-10-CM | POA: Diagnosis not present

## 2017-04-13 DIAGNOSIS — M6281 Muscle weakness (generalized): Secondary | ICD-10-CM | POA: Diagnosis not present

## 2017-04-13 DIAGNOSIS — J45998 Other asthma: Secondary | ICD-10-CM | POA: Diagnosis not present

## 2017-04-13 DIAGNOSIS — F0390 Unspecified dementia without behavioral disturbance: Secondary | ICD-10-CM | POA: Diagnosis not present

## 2017-04-23 DIAGNOSIS — M1000 Idiopathic gout, unspecified site: Secondary | ICD-10-CM | POA: Diagnosis not present

## 2017-04-23 DIAGNOSIS — J45998 Other asthma: Secondary | ICD-10-CM | POA: Diagnosis not present

## 2017-04-23 DIAGNOSIS — R52 Pain, unspecified: Secondary | ICD-10-CM | POA: Diagnosis not present

## 2017-04-23 DIAGNOSIS — E569 Vitamin deficiency, unspecified: Secondary | ICD-10-CM | POA: Diagnosis not present

## 2017-04-23 DIAGNOSIS — R197 Diarrhea, unspecified: Secondary | ICD-10-CM | POA: Diagnosis not present

## 2017-04-23 DIAGNOSIS — M6281 Muscle weakness (generalized): Secondary | ICD-10-CM | POA: Diagnosis not present

## 2017-04-23 DIAGNOSIS — F0390 Unspecified dementia without behavioral disturbance: Secondary | ICD-10-CM | POA: Diagnosis not present

## 2017-04-23 DIAGNOSIS — G301 Alzheimer's disease with late onset: Secondary | ICD-10-CM | POA: Diagnosis not present

## 2017-05-06 DIAGNOSIS — K5900 Constipation, unspecified: Secondary | ICD-10-CM | POA: Diagnosis not present

## 2017-05-06 DIAGNOSIS — H9193 Unspecified hearing loss, bilateral: Secondary | ICD-10-CM | POA: Diagnosis not present

## 2017-05-06 DIAGNOSIS — M6281 Muscle weakness (generalized): Secondary | ICD-10-CM | POA: Diagnosis not present

## 2017-05-06 DIAGNOSIS — R52 Pain, unspecified: Secondary | ICD-10-CM | POA: Diagnosis not present

## 2017-05-06 DIAGNOSIS — N189 Chronic kidney disease, unspecified: Secondary | ICD-10-CM | POA: Diagnosis not present

## 2017-05-06 DIAGNOSIS — J45998 Other asthma: Secondary | ICD-10-CM | POA: Diagnosis not present

## 2017-05-06 DIAGNOSIS — F0390 Unspecified dementia without behavioral disturbance: Secondary | ICD-10-CM | POA: Diagnosis not present

## 2017-05-06 DIAGNOSIS — E569 Vitamin deficiency, unspecified: Secondary | ICD-10-CM | POA: Diagnosis not present

## 2017-05-06 DIAGNOSIS — R197 Diarrhea, unspecified: Secondary | ICD-10-CM | POA: Diagnosis not present

## 2017-05-06 DIAGNOSIS — M1000 Idiopathic gout, unspecified site: Secondary | ICD-10-CM | POA: Diagnosis not present

## 2017-05-11 DIAGNOSIS — M6281 Muscle weakness (generalized): Secondary | ICD-10-CM | POA: Diagnosis not present

## 2017-05-11 DIAGNOSIS — R278 Other lack of coordination: Secondary | ICD-10-CM | POA: Diagnosis not present

## 2017-05-11 DIAGNOSIS — R2689 Other abnormalities of gait and mobility: Secondary | ICD-10-CM | POA: Diagnosis not present

## 2017-05-19 DIAGNOSIS — B351 Tinea unguium: Secondary | ICD-10-CM | POA: Diagnosis not present

## 2017-05-25 DIAGNOSIS — M6281 Muscle weakness (generalized): Secondary | ICD-10-CM | POA: Diagnosis not present

## 2017-05-25 DIAGNOSIS — R52 Pain, unspecified: Secondary | ICD-10-CM | POA: Diagnosis not present

## 2017-05-25 DIAGNOSIS — E569 Vitamin deficiency, unspecified: Secondary | ICD-10-CM | POA: Diagnosis not present

## 2017-05-25 DIAGNOSIS — J45998 Other asthma: Secondary | ICD-10-CM | POA: Diagnosis not present

## 2017-05-25 DIAGNOSIS — R197 Diarrhea, unspecified: Secondary | ICD-10-CM | POA: Diagnosis not present

## 2017-05-25 DIAGNOSIS — G301 Alzheimer's disease with late onset: Secondary | ICD-10-CM | POA: Diagnosis not present

## 2017-05-25 DIAGNOSIS — M1000 Idiopathic gout, unspecified site: Secondary | ICD-10-CM | POA: Diagnosis not present

## 2017-05-25 DIAGNOSIS — F0390 Unspecified dementia without behavioral disturbance: Secondary | ICD-10-CM | POA: Diagnosis not present

## 2017-06-01 DIAGNOSIS — I739 Peripheral vascular disease, unspecified: Secondary | ICD-10-CM | POA: Diagnosis not present

## 2017-06-01 DIAGNOSIS — L603 Nail dystrophy: Secondary | ICD-10-CM | POA: Diagnosis not present

## 2017-06-01 DIAGNOSIS — Q845 Enlarged and hypertrophic nails: Secondary | ICD-10-CM | POA: Diagnosis not present

## 2017-06-01 DIAGNOSIS — B351 Tinea unguium: Secondary | ICD-10-CM | POA: Diagnosis not present

## 2017-06-02 DIAGNOSIS — F0280 Dementia in other diseases classified elsewhere without behavioral disturbance: Secondary | ICD-10-CM | POA: Diagnosis not present

## 2017-06-02 DIAGNOSIS — G301 Alzheimer's disease with late onset: Secondary | ICD-10-CM | POA: Diagnosis not present

## 2017-06-03 DIAGNOSIS — M1000 Idiopathic gout, unspecified site: Secondary | ICD-10-CM | POA: Diagnosis not present

## 2017-06-03 DIAGNOSIS — K5900 Constipation, unspecified: Secondary | ICD-10-CM | POA: Diagnosis not present

## 2017-06-03 DIAGNOSIS — F0390 Unspecified dementia without behavioral disturbance: Secondary | ICD-10-CM | POA: Diagnosis not present

## 2017-06-03 DIAGNOSIS — R52 Pain, unspecified: Secondary | ICD-10-CM | POA: Diagnosis not present

## 2017-06-03 DIAGNOSIS — M6281 Muscle weakness (generalized): Secondary | ICD-10-CM | POA: Diagnosis not present

## 2017-06-03 DIAGNOSIS — H9193 Unspecified hearing loss, bilateral: Secondary | ICD-10-CM | POA: Diagnosis not present

## 2017-06-03 DIAGNOSIS — R197 Diarrhea, unspecified: Secondary | ICD-10-CM | POA: Diagnosis not present

## 2017-06-03 DIAGNOSIS — J45998 Other asthma: Secondary | ICD-10-CM | POA: Diagnosis not present

## 2017-06-03 DIAGNOSIS — E569 Vitamin deficiency, unspecified: Secondary | ICD-10-CM | POA: Diagnosis not present

## 2017-06-03 DIAGNOSIS — N189 Chronic kidney disease, unspecified: Secondary | ICD-10-CM | POA: Diagnosis not present

## 2017-06-10 DIAGNOSIS — M6281 Muscle weakness (generalized): Secondary | ICD-10-CM | POA: Diagnosis not present

## 2017-06-10 DIAGNOSIS — G8929 Other chronic pain: Secondary | ICD-10-CM | POA: Diagnosis not present

## 2017-06-16 DIAGNOSIS — J45998 Other asthma: Secondary | ICD-10-CM | POA: Diagnosis not present

## 2017-06-16 DIAGNOSIS — R52 Pain, unspecified: Secondary | ICD-10-CM | POA: Diagnosis not present

## 2017-06-16 DIAGNOSIS — M1000 Idiopathic gout, unspecified site: Secondary | ICD-10-CM | POA: Diagnosis not present

## 2017-06-16 DIAGNOSIS — N189 Chronic kidney disease, unspecified: Secondary | ICD-10-CM | POA: Diagnosis not present

## 2017-06-16 DIAGNOSIS — K5900 Constipation, unspecified: Secondary | ICD-10-CM | POA: Diagnosis not present

## 2017-06-16 DIAGNOSIS — H9193 Unspecified hearing loss, bilateral: Secondary | ICD-10-CM | POA: Diagnosis not present

## 2017-06-16 DIAGNOSIS — F0390 Unspecified dementia without behavioral disturbance: Secondary | ICD-10-CM | POA: Diagnosis not present

## 2017-06-16 DIAGNOSIS — M6281 Muscle weakness (generalized): Secondary | ICD-10-CM | POA: Diagnosis not present

## 2017-06-16 DIAGNOSIS — R197 Diarrhea, unspecified: Secondary | ICD-10-CM | POA: Diagnosis not present

## 2017-06-18 DIAGNOSIS — M6281 Muscle weakness (generalized): Secondary | ICD-10-CM | POA: Diagnosis not present

## 2017-06-18 DIAGNOSIS — K118 Other diseases of salivary glands: Secondary | ICD-10-CM | POA: Diagnosis not present

## 2017-06-18 DIAGNOSIS — K0889 Other specified disorders of teeth and supporting structures: Secondary | ICD-10-CM | POA: Diagnosis not present

## 2017-06-18 DIAGNOSIS — G8929 Other chronic pain: Secondary | ICD-10-CM | POA: Diagnosis not present

## 2017-06-21 DIAGNOSIS — D649 Anemia, unspecified: Secondary | ICD-10-CM | POA: Diagnosis not present

## 2017-06-21 DIAGNOSIS — I10 Essential (primary) hypertension: Secondary | ICD-10-CM | POA: Diagnosis not present

## 2017-06-23 DIAGNOSIS — K118 Other diseases of salivary glands: Secondary | ICD-10-CM | POA: Diagnosis not present

## 2017-06-23 DIAGNOSIS — K0889 Other specified disorders of teeth and supporting structures: Secondary | ICD-10-CM | POA: Diagnosis not present

## 2017-06-25 DIAGNOSIS — E569 Vitamin deficiency, unspecified: Secondary | ICD-10-CM | POA: Diagnosis not present

## 2017-06-25 DIAGNOSIS — J45998 Other asthma: Secondary | ICD-10-CM | POA: Diagnosis not present

## 2017-06-25 DIAGNOSIS — M1000 Idiopathic gout, unspecified site: Secondary | ICD-10-CM | POA: Diagnosis not present

## 2017-06-25 DIAGNOSIS — R52 Pain, unspecified: Secondary | ICD-10-CM | POA: Diagnosis not present

## 2017-06-25 DIAGNOSIS — R197 Diarrhea, unspecified: Secondary | ICD-10-CM | POA: Diagnosis not present

## 2017-06-25 DIAGNOSIS — F0390 Unspecified dementia without behavioral disturbance: Secondary | ICD-10-CM | POA: Diagnosis not present

## 2017-06-25 DIAGNOSIS — G301 Alzheimer's disease with late onset: Secondary | ICD-10-CM | POA: Diagnosis not present

## 2017-06-25 DIAGNOSIS — M6281 Muscle weakness (generalized): Secondary | ICD-10-CM | POA: Diagnosis not present

## 2017-07-01 DIAGNOSIS — R52 Pain, unspecified: Secondary | ICD-10-CM | POA: Diagnosis not present

## 2017-07-01 DIAGNOSIS — F0390 Unspecified dementia without behavioral disturbance: Secondary | ICD-10-CM | POA: Diagnosis not present

## 2017-07-01 DIAGNOSIS — R197 Diarrhea, unspecified: Secondary | ICD-10-CM | POA: Diagnosis not present

## 2017-07-01 DIAGNOSIS — M6281 Muscle weakness (generalized): Secondary | ICD-10-CM | POA: Diagnosis not present

## 2017-07-01 DIAGNOSIS — K0889 Other specified disorders of teeth and supporting structures: Secondary | ICD-10-CM | POA: Diagnosis not present

## 2017-07-01 DIAGNOSIS — E569 Vitamin deficiency, unspecified: Secondary | ICD-10-CM | POA: Diagnosis not present

## 2017-07-01 DIAGNOSIS — M1000 Idiopathic gout, unspecified site: Secondary | ICD-10-CM | POA: Diagnosis not present

## 2017-07-01 DIAGNOSIS — H9193 Unspecified hearing loss, bilateral: Secondary | ICD-10-CM | POA: Diagnosis not present

## 2017-07-01 DIAGNOSIS — J45998 Other asthma: Secondary | ICD-10-CM | POA: Diagnosis not present

## 2017-07-01 DIAGNOSIS — N189 Chronic kidney disease, unspecified: Secondary | ICD-10-CM | POA: Diagnosis not present

## 2017-07-01 DIAGNOSIS — K5900 Constipation, unspecified: Secondary | ICD-10-CM | POA: Diagnosis not present

## 2017-07-06 DIAGNOSIS — H34812 Central retinal vein occlusion, left eye, with macular edema: Secondary | ICD-10-CM | POA: Diagnosis not present

## 2017-07-06 DIAGNOSIS — H43813 Vitreous degeneration, bilateral: Secondary | ICD-10-CM | POA: Diagnosis not present

## 2017-07-08 DIAGNOSIS — H1032 Unspecified acute conjunctivitis, left eye: Secondary | ICD-10-CM | POA: Diagnosis not present

## 2017-07-28 DIAGNOSIS — G301 Alzheimer's disease with late onset: Secondary | ICD-10-CM | POA: Diagnosis not present

## 2017-07-28 DIAGNOSIS — F0280 Dementia in other diseases classified elsewhere without behavioral disturbance: Secondary | ICD-10-CM | POA: Diagnosis not present

## 2017-07-29 DIAGNOSIS — R197 Diarrhea, unspecified: Secondary | ICD-10-CM | POA: Diagnosis not present

## 2017-07-29 DIAGNOSIS — F0390 Unspecified dementia without behavioral disturbance: Secondary | ICD-10-CM | POA: Diagnosis not present

## 2017-07-29 DIAGNOSIS — E569 Vitamin deficiency, unspecified: Secondary | ICD-10-CM | POA: Diagnosis not present

## 2017-07-29 DIAGNOSIS — J45998 Other asthma: Secondary | ICD-10-CM | POA: Diagnosis not present

## 2017-07-29 DIAGNOSIS — R52 Pain, unspecified: Secondary | ICD-10-CM | POA: Diagnosis not present

## 2017-07-29 DIAGNOSIS — M1000 Idiopathic gout, unspecified site: Secondary | ICD-10-CM | POA: Diagnosis not present

## 2017-07-29 DIAGNOSIS — G301 Alzheimer's disease with late onset: Secondary | ICD-10-CM | POA: Diagnosis not present

## 2017-07-29 DIAGNOSIS — M6281 Muscle weakness (generalized): Secondary | ICD-10-CM | POA: Diagnosis not present

## 2017-08-04 DIAGNOSIS — K0889 Other specified disorders of teeth and supporting structures: Secondary | ICD-10-CM | POA: Diagnosis not present

## 2017-08-04 DIAGNOSIS — R197 Diarrhea, unspecified: Secondary | ICD-10-CM | POA: Diagnosis not present

## 2017-08-04 DIAGNOSIS — N189 Chronic kidney disease, unspecified: Secondary | ICD-10-CM | POA: Diagnosis not present

## 2017-08-04 DIAGNOSIS — K219 Gastro-esophageal reflux disease without esophagitis: Secondary | ICD-10-CM | POA: Diagnosis not present

## 2017-08-04 DIAGNOSIS — E569 Vitamin deficiency, unspecified: Secondary | ICD-10-CM | POA: Diagnosis not present

## 2017-08-04 DIAGNOSIS — M6281 Muscle weakness (generalized): Secondary | ICD-10-CM | POA: Diagnosis not present

## 2017-08-04 DIAGNOSIS — F0390 Unspecified dementia without behavioral disturbance: Secondary | ICD-10-CM | POA: Diagnosis not present

## 2017-08-04 DIAGNOSIS — R52 Pain, unspecified: Secondary | ICD-10-CM | POA: Diagnosis not present

## 2017-08-04 DIAGNOSIS — H34819 Central retinal vein occlusion, unspecified eye, with macular edema: Secondary | ICD-10-CM | POA: Diagnosis not present

## 2017-08-04 DIAGNOSIS — K5900 Constipation, unspecified: Secondary | ICD-10-CM | POA: Diagnosis not present

## 2017-08-04 DIAGNOSIS — M1000 Idiopathic gout, unspecified site: Secondary | ICD-10-CM | POA: Diagnosis not present

## 2017-08-04 DIAGNOSIS — J45998 Other asthma: Secondary | ICD-10-CM | POA: Diagnosis not present

## 2017-08-09 DIAGNOSIS — R350 Frequency of micturition: Secondary | ICD-10-CM | POA: Diagnosis not present

## 2017-08-09 DIAGNOSIS — R4182 Altered mental status, unspecified: Secondary | ICD-10-CM | POA: Diagnosis not present

## 2017-08-15 DIAGNOSIS — N390 Urinary tract infection, site not specified: Secondary | ICD-10-CM | POA: Diagnosis not present

## 2017-08-17 DIAGNOSIS — H34812 Central retinal vein occlusion, left eye, with macular edema: Secondary | ICD-10-CM | POA: Diagnosis not present

## 2017-08-27 DIAGNOSIS — R197 Diarrhea, unspecified: Secondary | ICD-10-CM | POA: Diagnosis not present

## 2017-08-27 DIAGNOSIS — R52 Pain, unspecified: Secondary | ICD-10-CM | POA: Diagnosis not present

## 2017-08-27 DIAGNOSIS — E569 Vitamin deficiency, unspecified: Secondary | ICD-10-CM | POA: Diagnosis not present

## 2017-08-27 DIAGNOSIS — F0390 Unspecified dementia without behavioral disturbance: Secondary | ICD-10-CM | POA: Diagnosis not present

## 2017-08-27 DIAGNOSIS — J45998 Other asthma: Secondary | ICD-10-CM | POA: Diagnosis not present

## 2017-08-27 DIAGNOSIS — G301 Alzheimer's disease with late onset: Secondary | ICD-10-CM | POA: Diagnosis not present

## 2017-08-27 DIAGNOSIS — M6281 Muscle weakness (generalized): Secondary | ICD-10-CM | POA: Diagnosis not present

## 2017-08-27 DIAGNOSIS — M1000 Idiopathic gout, unspecified site: Secondary | ICD-10-CM | POA: Diagnosis not present

## 2017-09-08 DIAGNOSIS — L603 Nail dystrophy: Secondary | ICD-10-CM | POA: Diagnosis not present

## 2017-09-08 DIAGNOSIS — B351 Tinea unguium: Secondary | ICD-10-CM | POA: Diagnosis not present

## 2017-09-08 DIAGNOSIS — Q845 Enlarged and hypertrophic nails: Secondary | ICD-10-CM | POA: Diagnosis not present

## 2017-09-08 DIAGNOSIS — G301 Alzheimer's disease with late onset: Secondary | ICD-10-CM | POA: Diagnosis not present

## 2017-09-08 DIAGNOSIS — F0280 Dementia in other diseases classified elsewhere without behavioral disturbance: Secondary | ICD-10-CM | POA: Diagnosis not present

## 2017-09-08 DIAGNOSIS — I739 Peripheral vascular disease, unspecified: Secondary | ICD-10-CM | POA: Diagnosis not present

## 2017-09-13 DIAGNOSIS — M5030 Other cervical disc degeneration, unspecified cervical region: Secondary | ICD-10-CM | POA: Diagnosis not present

## 2017-09-13 DIAGNOSIS — M542 Cervicalgia: Secondary | ICD-10-CM | POA: Diagnosis not present

## 2017-09-13 DIAGNOSIS — M25519 Pain in unspecified shoulder: Secondary | ICD-10-CM | POA: Diagnosis not present

## 2017-09-13 DIAGNOSIS — R05 Cough: Secondary | ICD-10-CM | POA: Diagnosis not present

## 2017-09-17 DIAGNOSIS — N189 Chronic kidney disease, unspecified: Secondary | ICD-10-CM | POA: Diagnosis not present

## 2017-09-17 DIAGNOSIS — R062 Wheezing: Secondary | ICD-10-CM | POA: Diagnosis not present

## 2017-09-17 DIAGNOSIS — R509 Fever, unspecified: Secondary | ICD-10-CM | POA: Diagnosis not present

## 2017-09-17 DIAGNOSIS — G8929 Other chronic pain: Secondary | ICD-10-CM | POA: Diagnosis not present

## 2017-09-17 DIAGNOSIS — M1000 Idiopathic gout, unspecified site: Secondary | ICD-10-CM | POA: Diagnosis not present

## 2017-09-17 DIAGNOSIS — R52 Pain, unspecified: Secondary | ICD-10-CM | POA: Diagnosis not present

## 2017-09-17 DIAGNOSIS — M6281 Muscle weakness (generalized): Secondary | ICD-10-CM | POA: Diagnosis not present

## 2017-09-17 DIAGNOSIS — H34819 Central retinal vein occlusion, unspecified eye, with macular edema: Secondary | ICD-10-CM | POA: Diagnosis not present

## 2017-09-17 DIAGNOSIS — R278 Other lack of coordination: Secondary | ICD-10-CM | POA: Diagnosis not present

## 2017-09-17 DIAGNOSIS — J069 Acute upper respiratory infection, unspecified: Secondary | ICD-10-CM | POA: Diagnosis not present

## 2017-09-17 DIAGNOSIS — R05 Cough: Secondary | ICD-10-CM | POA: Diagnosis not present

## 2017-09-17 DIAGNOSIS — R2689 Other abnormalities of gait and mobility: Secondary | ICD-10-CM | POA: Diagnosis not present

## 2017-09-17 DIAGNOSIS — R197 Diarrhea, unspecified: Secondary | ICD-10-CM | POA: Diagnosis not present

## 2017-09-17 DIAGNOSIS — R262 Difficulty in walking, not elsewhere classified: Secondary | ICD-10-CM | POA: Diagnosis not present

## 2017-09-17 DIAGNOSIS — J45998 Other asthma: Secondary | ICD-10-CM | POA: Diagnosis not present

## 2017-09-17 DIAGNOSIS — K0889 Other specified disorders of teeth and supporting structures: Secondary | ICD-10-CM | POA: Diagnosis not present

## 2017-09-17 DIAGNOSIS — F0390 Unspecified dementia without behavioral disturbance: Secondary | ICD-10-CM | POA: Diagnosis not present

## 2017-09-17 DIAGNOSIS — G309 Alzheimer's disease, unspecified: Secondary | ICD-10-CM | POA: Diagnosis not present

## 2017-09-17 DIAGNOSIS — K219 Gastro-esophageal reflux disease without esophagitis: Secondary | ICD-10-CM | POA: Diagnosis not present

## 2017-09-17 DIAGNOSIS — R918 Other nonspecific abnormal finding of lung field: Secondary | ICD-10-CM | POA: Diagnosis not present

## 2017-09-18 DIAGNOSIS — D649 Anemia, unspecified: Secondary | ICD-10-CM | POA: Diagnosis not present

## 2017-09-18 DIAGNOSIS — I10 Essential (primary) hypertension: Secondary | ICD-10-CM | POA: Diagnosis not present

## 2017-09-20 DIAGNOSIS — G309 Alzheimer's disease, unspecified: Secondary | ICD-10-CM | POA: Diagnosis not present

## 2017-09-20 DIAGNOSIS — R278 Other lack of coordination: Secondary | ICD-10-CM | POA: Diagnosis not present

## 2017-09-20 DIAGNOSIS — R319 Hematuria, unspecified: Secondary | ICD-10-CM | POA: Diagnosis not present

## 2017-09-20 DIAGNOSIS — R062 Wheezing: Secondary | ICD-10-CM | POA: Diagnosis not present

## 2017-09-20 DIAGNOSIS — R2689 Other abnormalities of gait and mobility: Secondary | ICD-10-CM | POA: Diagnosis not present

## 2017-09-20 DIAGNOSIS — M1000 Idiopathic gout, unspecified site: Secondary | ICD-10-CM | POA: Diagnosis not present

## 2017-09-20 DIAGNOSIS — I509 Heart failure, unspecified: Secondary | ICD-10-CM | POA: Diagnosis not present

## 2017-09-20 DIAGNOSIS — Z79899 Other long term (current) drug therapy: Secondary | ICD-10-CM | POA: Diagnosis not present

## 2017-09-20 DIAGNOSIS — R262 Difficulty in walking, not elsewhere classified: Secondary | ICD-10-CM | POA: Diagnosis not present

## 2017-09-20 DIAGNOSIS — R4182 Altered mental status, unspecified: Secondary | ICD-10-CM | POA: Diagnosis not present

## 2017-09-20 DIAGNOSIS — I10 Essential (primary) hypertension: Secondary | ICD-10-CM | POA: Diagnosis not present

## 2017-09-20 DIAGNOSIS — N390 Urinary tract infection, site not specified: Secondary | ICD-10-CM | POA: Diagnosis not present

## 2017-09-20 DIAGNOSIS — M6281 Muscle weakness (generalized): Secondary | ICD-10-CM | POA: Diagnosis not present

## 2017-09-21 DIAGNOSIS — M6281 Muscle weakness (generalized): Secondary | ICD-10-CM | POA: Diagnosis not present

## 2017-09-21 DIAGNOSIS — R278 Other lack of coordination: Secondary | ICD-10-CM | POA: Diagnosis not present

## 2017-09-21 DIAGNOSIS — R262 Difficulty in walking, not elsewhere classified: Secondary | ICD-10-CM | POA: Diagnosis not present

## 2017-09-21 DIAGNOSIS — R2689 Other abnormalities of gait and mobility: Secondary | ICD-10-CM | POA: Diagnosis not present

## 2017-09-21 DIAGNOSIS — G309 Alzheimer's disease, unspecified: Secondary | ICD-10-CM | POA: Diagnosis not present

## 2017-09-21 DIAGNOSIS — R062 Wheezing: Secondary | ICD-10-CM | POA: Diagnosis not present

## 2017-09-22 DIAGNOSIS — R278 Other lack of coordination: Secondary | ICD-10-CM | POA: Diagnosis not present

## 2017-09-22 DIAGNOSIS — R2689 Other abnormalities of gait and mobility: Secondary | ICD-10-CM | POA: Diagnosis not present

## 2017-09-22 DIAGNOSIS — M6281 Muscle weakness (generalized): Secondary | ICD-10-CM | POA: Diagnosis not present

## 2017-09-22 DIAGNOSIS — R262 Difficulty in walking, not elsewhere classified: Secondary | ICD-10-CM | POA: Diagnosis not present

## 2017-09-22 DIAGNOSIS — R062 Wheezing: Secondary | ICD-10-CM | POA: Diagnosis not present

## 2017-09-22 DIAGNOSIS — G309 Alzheimer's disease, unspecified: Secondary | ICD-10-CM | POA: Diagnosis not present

## 2017-09-23 DIAGNOSIS — R262 Difficulty in walking, not elsewhere classified: Secondary | ICD-10-CM | POA: Diagnosis not present

## 2017-09-23 DIAGNOSIS — R2689 Other abnormalities of gait and mobility: Secondary | ICD-10-CM | POA: Diagnosis not present

## 2017-09-23 DIAGNOSIS — R278 Other lack of coordination: Secondary | ICD-10-CM | POA: Diagnosis not present

## 2017-09-23 DIAGNOSIS — R062 Wheezing: Secondary | ICD-10-CM | POA: Diagnosis not present

## 2017-09-23 DIAGNOSIS — M6281 Muscle weakness (generalized): Secondary | ICD-10-CM | POA: Diagnosis not present

## 2017-09-23 DIAGNOSIS — G309 Alzheimer's disease, unspecified: Secondary | ICD-10-CM | POA: Diagnosis not present

## 2017-09-24 DIAGNOSIS — R2689 Other abnormalities of gait and mobility: Secondary | ICD-10-CM | POA: Diagnosis not present

## 2017-09-24 DIAGNOSIS — R062 Wheezing: Secondary | ICD-10-CM | POA: Diagnosis not present

## 2017-09-24 DIAGNOSIS — G309 Alzheimer's disease, unspecified: Secondary | ICD-10-CM | POA: Diagnosis not present

## 2017-09-24 DIAGNOSIS — R278 Other lack of coordination: Secondary | ICD-10-CM | POA: Diagnosis not present

## 2017-09-24 DIAGNOSIS — R262 Difficulty in walking, not elsewhere classified: Secondary | ICD-10-CM | POA: Diagnosis not present

## 2017-09-24 DIAGNOSIS — M6281 Muscle weakness (generalized): Secondary | ICD-10-CM | POA: Diagnosis not present

## 2017-09-27 DIAGNOSIS — M6281 Muscle weakness (generalized): Secondary | ICD-10-CM | POA: Diagnosis not present

## 2017-09-27 DIAGNOSIS — G309 Alzheimer's disease, unspecified: Secondary | ICD-10-CM | POA: Diagnosis not present

## 2017-09-27 DIAGNOSIS — R2689 Other abnormalities of gait and mobility: Secondary | ICD-10-CM | POA: Diagnosis not present

## 2017-09-27 DIAGNOSIS — R262 Difficulty in walking, not elsewhere classified: Secondary | ICD-10-CM | POA: Diagnosis not present

## 2017-09-27 DIAGNOSIS — R278 Other lack of coordination: Secondary | ICD-10-CM | POA: Diagnosis not present

## 2017-09-27 DIAGNOSIS — R062 Wheezing: Secondary | ICD-10-CM | POA: Diagnosis not present

## 2017-09-28 DIAGNOSIS — R278 Other lack of coordination: Secondary | ICD-10-CM | POA: Diagnosis not present

## 2017-09-28 DIAGNOSIS — R2689 Other abnormalities of gait and mobility: Secondary | ICD-10-CM | POA: Diagnosis not present

## 2017-09-28 DIAGNOSIS — G309 Alzheimer's disease, unspecified: Secondary | ICD-10-CM | POA: Diagnosis not present

## 2017-09-28 DIAGNOSIS — M6281 Muscle weakness (generalized): Secondary | ICD-10-CM | POA: Diagnosis not present

## 2017-09-28 DIAGNOSIS — R262 Difficulty in walking, not elsewhere classified: Secondary | ICD-10-CM | POA: Diagnosis not present

## 2017-09-28 DIAGNOSIS — R062 Wheezing: Secondary | ICD-10-CM | POA: Diagnosis not present

## 2017-09-30 DIAGNOSIS — H34812 Central retinal vein occlusion, left eye, with macular edema: Secondary | ICD-10-CM | POA: Diagnosis not present

## 2017-10-01 DIAGNOSIS — H1132 Conjunctival hemorrhage, left eye: Secondary | ICD-10-CM | POA: Diagnosis not present

## 2017-10-04 DIAGNOSIS — R109 Unspecified abdominal pain: Secondary | ICD-10-CM | POA: Diagnosis not present

## 2017-10-05 DIAGNOSIS — D649 Anemia, unspecified: Secondary | ICD-10-CM | POA: Diagnosis not present

## 2017-10-05 DIAGNOSIS — R109 Unspecified abdominal pain: Secondary | ICD-10-CM | POA: Diagnosis not present

## 2017-10-07 DIAGNOSIS — M25569 Pain in unspecified knee: Secondary | ICD-10-CM | POA: Diagnosis not present

## 2017-10-07 DIAGNOSIS — J069 Acute upper respiratory infection, unspecified: Secondary | ICD-10-CM | POA: Diagnosis not present

## 2017-10-07 DIAGNOSIS — G8929 Other chronic pain: Secondary | ICD-10-CM | POA: Diagnosis not present

## 2017-10-07 DIAGNOSIS — R262 Difficulty in walking, not elsewhere classified: Secondary | ICD-10-CM | POA: Diagnosis not present

## 2017-10-07 DIAGNOSIS — K5900 Constipation, unspecified: Secondary | ICD-10-CM | POA: Diagnosis not present

## 2017-10-07 DIAGNOSIS — M6281 Muscle weakness (generalized): Secondary | ICD-10-CM | POA: Diagnosis not present

## 2017-10-07 DIAGNOSIS — G309 Alzheimer's disease, unspecified: Secondary | ICD-10-CM | POA: Diagnosis not present

## 2017-10-07 DIAGNOSIS — R2689 Other abnormalities of gait and mobility: Secondary | ICD-10-CM | POA: Diagnosis not present

## 2017-10-07 DIAGNOSIS — R05 Cough: Secondary | ICD-10-CM | POA: Diagnosis not present

## 2017-10-07 DIAGNOSIS — R062 Wheezing: Secondary | ICD-10-CM | POA: Diagnosis not present

## 2017-10-07 DIAGNOSIS — R278 Other lack of coordination: Secondary | ICD-10-CM | POA: Diagnosis not present

## 2017-10-08 DIAGNOSIS — M6281 Muscle weakness (generalized): Secondary | ICD-10-CM | POA: Diagnosis not present

## 2017-10-08 DIAGNOSIS — R278 Other lack of coordination: Secondary | ICD-10-CM | POA: Diagnosis not present

## 2017-10-08 DIAGNOSIS — D649 Anemia, unspecified: Secondary | ICD-10-CM | POA: Diagnosis not present

## 2017-10-08 DIAGNOSIS — I2510 Atherosclerotic heart disease of native coronary artery without angina pectoris: Secondary | ICD-10-CM | POA: Diagnosis not present

## 2017-10-08 DIAGNOSIS — R2689 Other abnormalities of gait and mobility: Secondary | ICD-10-CM | POA: Diagnosis not present

## 2017-10-08 DIAGNOSIS — E7800 Pure hypercholesterolemia, unspecified: Secondary | ICD-10-CM | POA: Diagnosis not present

## 2017-10-08 DIAGNOSIS — M25569 Pain in unspecified knee: Secondary | ICD-10-CM | POA: Diagnosis not present

## 2017-10-08 DIAGNOSIS — R109 Unspecified abdominal pain: Secondary | ICD-10-CM | POA: Diagnosis not present

## 2017-10-08 DIAGNOSIS — R262 Difficulty in walking, not elsewhere classified: Secondary | ICD-10-CM | POA: Diagnosis not present

## 2017-10-08 DIAGNOSIS — R062 Wheezing: Secondary | ICD-10-CM | POA: Diagnosis not present

## 2017-10-08 DIAGNOSIS — G309 Alzheimer's disease, unspecified: Secondary | ICD-10-CM | POA: Diagnosis not present

## 2017-10-11 DIAGNOSIS — M6281 Muscle weakness (generalized): Secondary | ICD-10-CM | POA: Diagnosis not present

## 2017-10-11 DIAGNOSIS — R262 Difficulty in walking, not elsewhere classified: Secondary | ICD-10-CM | POA: Diagnosis not present

## 2017-10-11 DIAGNOSIS — R062 Wheezing: Secondary | ICD-10-CM | POA: Diagnosis not present

## 2017-10-11 DIAGNOSIS — R278 Other lack of coordination: Secondary | ICD-10-CM | POA: Diagnosis not present

## 2017-10-11 DIAGNOSIS — G309 Alzheimer's disease, unspecified: Secondary | ICD-10-CM | POA: Diagnosis not present

## 2017-10-11 DIAGNOSIS — R2689 Other abnormalities of gait and mobility: Secondary | ICD-10-CM | POA: Diagnosis not present

## 2017-10-12 DIAGNOSIS — R262 Difficulty in walking, not elsewhere classified: Secondary | ICD-10-CM | POA: Diagnosis not present

## 2017-10-12 DIAGNOSIS — G309 Alzheimer's disease, unspecified: Secondary | ICD-10-CM | POA: Diagnosis not present

## 2017-10-12 DIAGNOSIS — R2689 Other abnormalities of gait and mobility: Secondary | ICD-10-CM | POA: Diagnosis not present

## 2017-10-12 DIAGNOSIS — R062 Wheezing: Secondary | ICD-10-CM | POA: Diagnosis not present

## 2017-10-12 DIAGNOSIS — M6281 Muscle weakness (generalized): Secondary | ICD-10-CM | POA: Diagnosis not present

## 2017-10-12 DIAGNOSIS — R278 Other lack of coordination: Secondary | ICD-10-CM | POA: Diagnosis not present

## 2017-10-13 DIAGNOSIS — G301 Alzheimer's disease with late onset: Secondary | ICD-10-CM | POA: Diagnosis not present

## 2017-10-13 DIAGNOSIS — R2689 Other abnormalities of gait and mobility: Secondary | ICD-10-CM | POA: Diagnosis not present

## 2017-10-13 DIAGNOSIS — R278 Other lack of coordination: Secondary | ICD-10-CM | POA: Diagnosis not present

## 2017-10-13 DIAGNOSIS — G309 Alzheimer's disease, unspecified: Secondary | ICD-10-CM | POA: Diagnosis not present

## 2017-10-13 DIAGNOSIS — M6281 Muscle weakness (generalized): Secondary | ICD-10-CM | POA: Diagnosis not present

## 2017-10-13 DIAGNOSIS — F0280 Dementia in other diseases classified elsewhere without behavioral disturbance: Secondary | ICD-10-CM | POA: Diagnosis not present

## 2017-10-13 DIAGNOSIS — R062 Wheezing: Secondary | ICD-10-CM | POA: Diagnosis not present

## 2017-10-13 DIAGNOSIS — R262 Difficulty in walking, not elsewhere classified: Secondary | ICD-10-CM | POA: Diagnosis not present

## 2017-10-14 DIAGNOSIS — R262 Difficulty in walking, not elsewhere classified: Secondary | ICD-10-CM | POA: Diagnosis not present

## 2017-10-14 DIAGNOSIS — R278 Other lack of coordination: Secondary | ICD-10-CM | POA: Diagnosis not present

## 2017-10-14 DIAGNOSIS — M6281 Muscle weakness (generalized): Secondary | ICD-10-CM | POA: Diagnosis not present

## 2017-10-14 DIAGNOSIS — R2689 Other abnormalities of gait and mobility: Secondary | ICD-10-CM | POA: Diagnosis not present

## 2017-10-14 DIAGNOSIS — G309 Alzheimer's disease, unspecified: Secondary | ICD-10-CM | POA: Diagnosis not present

## 2017-10-14 DIAGNOSIS — R062 Wheezing: Secondary | ICD-10-CM | POA: Diagnosis not present

## 2017-10-15 DIAGNOSIS — R278 Other lack of coordination: Secondary | ICD-10-CM | POA: Diagnosis not present

## 2017-10-15 DIAGNOSIS — R2689 Other abnormalities of gait and mobility: Secondary | ICD-10-CM | POA: Diagnosis not present

## 2017-10-15 DIAGNOSIS — M6281 Muscle weakness (generalized): Secondary | ICD-10-CM | POA: Diagnosis not present

## 2017-10-15 DIAGNOSIS — R062 Wheezing: Secondary | ICD-10-CM | POA: Diagnosis not present

## 2017-10-15 DIAGNOSIS — G309 Alzheimer's disease, unspecified: Secondary | ICD-10-CM | POA: Diagnosis not present

## 2017-10-15 DIAGNOSIS — R262 Difficulty in walking, not elsewhere classified: Secondary | ICD-10-CM | POA: Diagnosis not present

## 2017-10-18 DIAGNOSIS — M6281 Muscle weakness (generalized): Secondary | ICD-10-CM | POA: Diagnosis not present

## 2017-10-18 DIAGNOSIS — R278 Other lack of coordination: Secondary | ICD-10-CM | POA: Diagnosis not present

## 2017-10-18 DIAGNOSIS — R262 Difficulty in walking, not elsewhere classified: Secondary | ICD-10-CM | POA: Diagnosis not present

## 2017-10-18 DIAGNOSIS — R2689 Other abnormalities of gait and mobility: Secondary | ICD-10-CM | POA: Diagnosis not present

## 2017-10-18 DIAGNOSIS — G309 Alzheimer's disease, unspecified: Secondary | ICD-10-CM | POA: Diagnosis not present

## 2017-10-18 DIAGNOSIS — M25569 Pain in unspecified knee: Secondary | ICD-10-CM | POA: Diagnosis not present

## 2017-10-18 DIAGNOSIS — R062 Wheezing: Secondary | ICD-10-CM | POA: Diagnosis not present

## 2017-10-19 DIAGNOSIS — R2689 Other abnormalities of gait and mobility: Secondary | ICD-10-CM | POA: Diagnosis not present

## 2017-10-19 DIAGNOSIS — R062 Wheezing: Secondary | ICD-10-CM | POA: Diagnosis not present

## 2017-10-19 DIAGNOSIS — R262 Difficulty in walking, not elsewhere classified: Secondary | ICD-10-CM | POA: Diagnosis not present

## 2017-10-19 DIAGNOSIS — R278 Other lack of coordination: Secondary | ICD-10-CM | POA: Diagnosis not present

## 2017-10-19 DIAGNOSIS — M6281 Muscle weakness (generalized): Secondary | ICD-10-CM | POA: Diagnosis not present

## 2017-10-19 DIAGNOSIS — G309 Alzheimer's disease, unspecified: Secondary | ICD-10-CM | POA: Diagnosis not present

## 2017-10-20 DIAGNOSIS — R278 Other lack of coordination: Secondary | ICD-10-CM | POA: Diagnosis not present

## 2017-10-20 DIAGNOSIS — M6281 Muscle weakness (generalized): Secondary | ICD-10-CM | POA: Diagnosis not present

## 2017-10-20 DIAGNOSIS — R2689 Other abnormalities of gait and mobility: Secondary | ICD-10-CM | POA: Diagnosis not present

## 2017-10-20 DIAGNOSIS — R262 Difficulty in walking, not elsewhere classified: Secondary | ICD-10-CM | POA: Diagnosis not present

## 2017-10-20 DIAGNOSIS — R062 Wheezing: Secondary | ICD-10-CM | POA: Diagnosis not present

## 2017-10-20 DIAGNOSIS — G309 Alzheimer's disease, unspecified: Secondary | ICD-10-CM | POA: Diagnosis not present

## 2017-10-22 DIAGNOSIS — M1712 Unilateral primary osteoarthritis, left knee: Secondary | ICD-10-CM | POA: Diagnosis not present

## 2017-10-22 DIAGNOSIS — R509 Fever, unspecified: Secondary | ICD-10-CM | POA: Diagnosis not present

## 2017-10-22 DIAGNOSIS — N189 Chronic kidney disease, unspecified: Secondary | ICD-10-CM | POA: Diagnosis not present

## 2017-10-22 DIAGNOSIS — R05 Cough: Secondary | ICD-10-CM | POA: Diagnosis not present

## 2017-10-22 DIAGNOSIS — J45998 Other asthma: Secondary | ICD-10-CM | POA: Diagnosis not present

## 2017-10-22 DIAGNOSIS — M6281 Muscle weakness (generalized): Secondary | ICD-10-CM | POA: Diagnosis not present

## 2017-10-22 DIAGNOSIS — K0889 Other specified disorders of teeth and supporting structures: Secondary | ICD-10-CM | POA: Diagnosis not present

## 2017-10-22 DIAGNOSIS — K219 Gastro-esophageal reflux disease without esophagitis: Secondary | ICD-10-CM | POA: Diagnosis not present

## 2017-10-22 DIAGNOSIS — F0390 Unspecified dementia without behavioral disturbance: Secondary | ICD-10-CM | POA: Diagnosis not present

## 2017-10-22 DIAGNOSIS — M1000 Idiopathic gout, unspecified site: Secondary | ICD-10-CM | POA: Diagnosis not present

## 2017-10-22 DIAGNOSIS — M25569 Pain in unspecified knee: Secondary | ICD-10-CM | POA: Diagnosis not present

## 2017-10-22 DIAGNOSIS — R197 Diarrhea, unspecified: Secondary | ICD-10-CM | POA: Diagnosis not present

## 2017-10-22 DIAGNOSIS — R52 Pain, unspecified: Secondary | ICD-10-CM | POA: Diagnosis not present

## 2017-10-24 DIAGNOSIS — R062 Wheezing: Secondary | ICD-10-CM | POA: Diagnosis not present

## 2017-10-24 DIAGNOSIS — M6281 Muscle weakness (generalized): Secondary | ICD-10-CM | POA: Diagnosis not present

## 2017-10-24 DIAGNOSIS — R262 Difficulty in walking, not elsewhere classified: Secondary | ICD-10-CM | POA: Diagnosis not present

## 2017-10-24 DIAGNOSIS — R278 Other lack of coordination: Secondary | ICD-10-CM | POA: Diagnosis not present

## 2017-10-24 DIAGNOSIS — R2689 Other abnormalities of gait and mobility: Secondary | ICD-10-CM | POA: Diagnosis not present

## 2017-10-24 DIAGNOSIS — G309 Alzheimer's disease, unspecified: Secondary | ICD-10-CM | POA: Diagnosis not present

## 2017-10-25 DIAGNOSIS — R262 Difficulty in walking, not elsewhere classified: Secondary | ICD-10-CM | POA: Diagnosis not present

## 2017-10-25 DIAGNOSIS — R0989 Other specified symptoms and signs involving the circulatory and respiratory systems: Secondary | ICD-10-CM | POA: Diagnosis not present

## 2017-10-25 DIAGNOSIS — R2689 Other abnormalities of gait and mobility: Secondary | ICD-10-CM | POA: Diagnosis not present

## 2017-10-25 DIAGNOSIS — R278 Other lack of coordination: Secondary | ICD-10-CM | POA: Diagnosis not present

## 2017-10-25 DIAGNOSIS — M6281 Muscle weakness (generalized): Secondary | ICD-10-CM | POA: Diagnosis not present

## 2017-10-25 DIAGNOSIS — G309 Alzheimer's disease, unspecified: Secondary | ICD-10-CM | POA: Diagnosis not present

## 2017-10-25 DIAGNOSIS — R062 Wheezing: Secondary | ICD-10-CM | POA: Diagnosis not present

## 2017-10-25 DIAGNOSIS — R05 Cough: Secondary | ICD-10-CM | POA: Diagnosis not present

## 2017-10-26 DIAGNOSIS — R062 Wheezing: Secondary | ICD-10-CM | POA: Diagnosis not present

## 2017-10-26 DIAGNOSIS — R278 Other lack of coordination: Secondary | ICD-10-CM | POA: Diagnosis not present

## 2017-10-26 DIAGNOSIS — R2689 Other abnormalities of gait and mobility: Secondary | ICD-10-CM | POA: Diagnosis not present

## 2017-10-26 DIAGNOSIS — R262 Difficulty in walking, not elsewhere classified: Secondary | ICD-10-CM | POA: Diagnosis not present

## 2017-10-26 DIAGNOSIS — G309 Alzheimer's disease, unspecified: Secondary | ICD-10-CM | POA: Diagnosis not present

## 2017-10-26 DIAGNOSIS — M6281 Muscle weakness (generalized): Secondary | ICD-10-CM | POA: Diagnosis not present

## 2017-10-30 DIAGNOSIS — I739 Peripheral vascular disease, unspecified: Secondary | ICD-10-CM | POA: Diagnosis not present

## 2017-10-30 DIAGNOSIS — G301 Alzheimer's disease with late onset: Secondary | ICD-10-CM | POA: Diagnosis not present

## 2017-11-10 DIAGNOSIS — L603 Nail dystrophy: Secondary | ICD-10-CM | POA: Diagnosis not present

## 2017-11-10 DIAGNOSIS — G301 Alzheimer's disease with late onset: Secondary | ICD-10-CM | POA: Diagnosis not present

## 2017-11-10 DIAGNOSIS — B351 Tinea unguium: Secondary | ICD-10-CM | POA: Diagnosis not present

## 2017-11-10 DIAGNOSIS — I739 Peripheral vascular disease, unspecified: Secondary | ICD-10-CM | POA: Diagnosis not present

## 2017-11-10 DIAGNOSIS — Q845 Enlarged and hypertrophic nails: Secondary | ICD-10-CM | POA: Diagnosis not present

## 2017-11-10 DIAGNOSIS — F0280 Dementia in other diseases classified elsewhere without behavioral disturbance: Secondary | ICD-10-CM | POA: Diagnosis not present

## 2017-11-11 DIAGNOSIS — H34812 Central retinal vein occlusion, left eye, with macular edema: Secondary | ICD-10-CM | POA: Diagnosis not present

## 2017-11-15 DIAGNOSIS — H579 Unspecified disorder of eye and adnexa: Secondary | ICD-10-CM | POA: Diagnosis not present

## 2017-11-26 DIAGNOSIS — M6281 Muscle weakness (generalized): Secondary | ICD-10-CM | POA: Diagnosis not present

## 2017-11-26 DIAGNOSIS — K219 Gastro-esophageal reflux disease without esophagitis: Secondary | ICD-10-CM | POA: Diagnosis not present

## 2017-11-26 DIAGNOSIS — J45998 Other asthma: Secondary | ICD-10-CM | POA: Diagnosis not present

## 2017-11-26 DIAGNOSIS — R197 Diarrhea, unspecified: Secondary | ICD-10-CM | POA: Diagnosis not present

## 2017-11-26 DIAGNOSIS — M1000 Idiopathic gout, unspecified site: Secondary | ICD-10-CM | POA: Diagnosis not present

## 2017-11-26 DIAGNOSIS — F0390 Unspecified dementia without behavioral disturbance: Secondary | ICD-10-CM | POA: Diagnosis not present

## 2017-11-26 DIAGNOSIS — N189 Chronic kidney disease, unspecified: Secondary | ICD-10-CM | POA: Diagnosis not present

## 2017-11-26 DIAGNOSIS — K5900 Constipation, unspecified: Secondary | ICD-10-CM | POA: Diagnosis not present

## 2017-11-26 DIAGNOSIS — R52 Pain, unspecified: Secondary | ICD-10-CM | POA: Diagnosis not present

## 2017-11-29 DIAGNOSIS — R197 Diarrhea, unspecified: Secondary | ICD-10-CM | POA: Diagnosis not present

## 2017-11-29 DIAGNOSIS — E569 Vitamin deficiency, unspecified: Secondary | ICD-10-CM | POA: Diagnosis not present

## 2017-12-08 DIAGNOSIS — G301 Alzheimer's disease with late onset: Secondary | ICD-10-CM | POA: Diagnosis not present

## 2017-12-08 DIAGNOSIS — F0280 Dementia in other diseases classified elsewhere without behavioral disturbance: Secondary | ICD-10-CM | POA: Diagnosis not present

## 2017-12-22 DIAGNOSIS — M6281 Muscle weakness (generalized): Secondary | ICD-10-CM | POA: Diagnosis not present

## 2017-12-22 DIAGNOSIS — G309 Alzheimer's disease, unspecified: Secondary | ICD-10-CM | POA: Diagnosis not present

## 2017-12-22 DIAGNOSIS — G8929 Other chronic pain: Secondary | ICD-10-CM | POA: Diagnosis not present

## 2017-12-22 DIAGNOSIS — R278 Other lack of coordination: Secondary | ICD-10-CM | POA: Diagnosis not present

## 2017-12-22 DIAGNOSIS — R062 Wheezing: Secondary | ICD-10-CM | POA: Diagnosis not present

## 2017-12-22 DIAGNOSIS — R2689 Other abnormalities of gait and mobility: Secondary | ICD-10-CM | POA: Diagnosis not present

## 2017-12-22 DIAGNOSIS — J069 Acute upper respiratory infection, unspecified: Secondary | ICD-10-CM | POA: Diagnosis not present

## 2017-12-22 DIAGNOSIS — R262 Difficulty in walking, not elsewhere classified: Secondary | ICD-10-CM | POA: Diagnosis not present

## 2017-12-22 DIAGNOSIS — R05 Cough: Secondary | ICD-10-CM | POA: Diagnosis not present

## 2017-12-23 DIAGNOSIS — R2689 Other abnormalities of gait and mobility: Secondary | ICD-10-CM | POA: Diagnosis not present

## 2017-12-23 DIAGNOSIS — R062 Wheezing: Secondary | ICD-10-CM | POA: Diagnosis not present

## 2017-12-23 DIAGNOSIS — R262 Difficulty in walking, not elsewhere classified: Secondary | ICD-10-CM | POA: Diagnosis not present

## 2017-12-23 DIAGNOSIS — R278 Other lack of coordination: Secondary | ICD-10-CM | POA: Diagnosis not present

## 2017-12-23 DIAGNOSIS — G309 Alzheimer's disease, unspecified: Secondary | ICD-10-CM | POA: Diagnosis not present

## 2017-12-23 DIAGNOSIS — M6281 Muscle weakness (generalized): Secondary | ICD-10-CM | POA: Diagnosis not present

## 2017-12-23 DIAGNOSIS — H348122 Central retinal vein occlusion, left eye, stable: Secondary | ICD-10-CM | POA: Diagnosis not present

## 2017-12-23 DIAGNOSIS — H43813 Vitreous degeneration, bilateral: Secondary | ICD-10-CM | POA: Diagnosis not present

## 2017-12-24 DIAGNOSIS — R062 Wheezing: Secondary | ICD-10-CM | POA: Diagnosis not present

## 2017-12-24 DIAGNOSIS — R2689 Other abnormalities of gait and mobility: Secondary | ICD-10-CM | POA: Diagnosis not present

## 2017-12-24 DIAGNOSIS — M6281 Muscle weakness (generalized): Secondary | ICD-10-CM | POA: Diagnosis not present

## 2017-12-24 DIAGNOSIS — G309 Alzheimer's disease, unspecified: Secondary | ICD-10-CM | POA: Diagnosis not present

## 2017-12-24 DIAGNOSIS — R262 Difficulty in walking, not elsewhere classified: Secondary | ICD-10-CM | POA: Diagnosis not present

## 2017-12-24 DIAGNOSIS — R278 Other lack of coordination: Secondary | ICD-10-CM | POA: Diagnosis not present

## 2017-12-27 DIAGNOSIS — R2689 Other abnormalities of gait and mobility: Secondary | ICD-10-CM | POA: Diagnosis not present

## 2017-12-27 DIAGNOSIS — G309 Alzheimer's disease, unspecified: Secondary | ICD-10-CM | POA: Diagnosis not present

## 2017-12-27 DIAGNOSIS — R278 Other lack of coordination: Secondary | ICD-10-CM | POA: Diagnosis not present

## 2017-12-27 DIAGNOSIS — R262 Difficulty in walking, not elsewhere classified: Secondary | ICD-10-CM | POA: Diagnosis not present

## 2017-12-27 DIAGNOSIS — M6281 Muscle weakness (generalized): Secondary | ICD-10-CM | POA: Diagnosis not present

## 2017-12-27 DIAGNOSIS — R062 Wheezing: Secondary | ICD-10-CM | POA: Diagnosis not present

## 2017-12-28 DIAGNOSIS — M6281 Muscle weakness (generalized): Secondary | ICD-10-CM | POA: Diagnosis not present

## 2017-12-28 DIAGNOSIS — G309 Alzheimer's disease, unspecified: Secondary | ICD-10-CM | POA: Diagnosis not present

## 2017-12-28 DIAGNOSIS — R262 Difficulty in walking, not elsewhere classified: Secondary | ICD-10-CM | POA: Diagnosis not present

## 2017-12-28 DIAGNOSIS — R062 Wheezing: Secondary | ICD-10-CM | POA: Diagnosis not present

## 2017-12-28 DIAGNOSIS — R278 Other lack of coordination: Secondary | ICD-10-CM | POA: Diagnosis not present

## 2017-12-28 DIAGNOSIS — R2689 Other abnormalities of gait and mobility: Secondary | ICD-10-CM | POA: Diagnosis not present

## 2017-12-29 DIAGNOSIS — M6281 Muscle weakness (generalized): Secondary | ICD-10-CM | POA: Diagnosis not present

## 2017-12-29 DIAGNOSIS — R062 Wheezing: Secondary | ICD-10-CM | POA: Diagnosis not present

## 2017-12-29 DIAGNOSIS — R278 Other lack of coordination: Secondary | ICD-10-CM | POA: Diagnosis not present

## 2017-12-29 DIAGNOSIS — G309 Alzheimer's disease, unspecified: Secondary | ICD-10-CM | POA: Diagnosis not present

## 2017-12-29 DIAGNOSIS — R262 Difficulty in walking, not elsewhere classified: Secondary | ICD-10-CM | POA: Diagnosis not present

## 2017-12-29 DIAGNOSIS — R2689 Other abnormalities of gait and mobility: Secondary | ICD-10-CM | POA: Diagnosis not present

## 2017-12-30 DIAGNOSIS — R062 Wheezing: Secondary | ICD-10-CM | POA: Diagnosis not present

## 2017-12-30 DIAGNOSIS — R278 Other lack of coordination: Secondary | ICD-10-CM | POA: Diagnosis not present

## 2017-12-30 DIAGNOSIS — G309 Alzheimer's disease, unspecified: Secondary | ICD-10-CM | POA: Diagnosis not present

## 2017-12-30 DIAGNOSIS — R262 Difficulty in walking, not elsewhere classified: Secondary | ICD-10-CM | POA: Diagnosis not present

## 2017-12-30 DIAGNOSIS — R2689 Other abnormalities of gait and mobility: Secondary | ICD-10-CM | POA: Diagnosis not present

## 2017-12-30 DIAGNOSIS — M6281 Muscle weakness (generalized): Secondary | ICD-10-CM | POA: Diagnosis not present

## 2017-12-31 DIAGNOSIS — J069 Acute upper respiratory infection, unspecified: Secondary | ICD-10-CM | POA: Diagnosis not present

## 2017-12-31 DIAGNOSIS — M6281 Muscle weakness (generalized): Secondary | ICD-10-CM | POA: Diagnosis not present

## 2017-12-31 DIAGNOSIS — R2689 Other abnormalities of gait and mobility: Secondary | ICD-10-CM | POA: Diagnosis not present

## 2017-12-31 DIAGNOSIS — R262 Difficulty in walking, not elsewhere classified: Secondary | ICD-10-CM | POA: Diagnosis not present

## 2017-12-31 DIAGNOSIS — R278 Other lack of coordination: Secondary | ICD-10-CM | POA: Diagnosis not present

## 2017-12-31 DIAGNOSIS — G309 Alzheimer's disease, unspecified: Secondary | ICD-10-CM | POA: Diagnosis not present

## 2017-12-31 DIAGNOSIS — R062 Wheezing: Secondary | ICD-10-CM | POA: Diagnosis not present

## 2017-12-31 DIAGNOSIS — R05 Cough: Secondary | ICD-10-CM | POA: Diagnosis not present

## 2017-12-31 DIAGNOSIS — G8929 Other chronic pain: Secondary | ICD-10-CM | POA: Diagnosis not present

## 2018-01-03 DIAGNOSIS — R2689 Other abnormalities of gait and mobility: Secondary | ICD-10-CM | POA: Diagnosis not present

## 2018-01-03 DIAGNOSIS — R278 Other lack of coordination: Secondary | ICD-10-CM | POA: Diagnosis not present

## 2018-01-03 DIAGNOSIS — R262 Difficulty in walking, not elsewhere classified: Secondary | ICD-10-CM | POA: Diagnosis not present

## 2018-01-03 DIAGNOSIS — R062 Wheezing: Secondary | ICD-10-CM | POA: Diagnosis not present

## 2018-01-03 DIAGNOSIS — M6281 Muscle weakness (generalized): Secondary | ICD-10-CM | POA: Diagnosis not present

## 2018-01-03 DIAGNOSIS — G309 Alzheimer's disease, unspecified: Secondary | ICD-10-CM | POA: Diagnosis not present

## 2018-01-04 DIAGNOSIS — R062 Wheezing: Secondary | ICD-10-CM | POA: Diagnosis not present

## 2018-01-04 DIAGNOSIS — R278 Other lack of coordination: Secondary | ICD-10-CM | POA: Diagnosis not present

## 2018-01-04 DIAGNOSIS — R262 Difficulty in walking, not elsewhere classified: Secondary | ICD-10-CM | POA: Diagnosis not present

## 2018-01-04 DIAGNOSIS — G309 Alzheimer's disease, unspecified: Secondary | ICD-10-CM | POA: Diagnosis not present

## 2018-01-04 DIAGNOSIS — M6281 Muscle weakness (generalized): Secondary | ICD-10-CM | POA: Diagnosis not present

## 2018-01-04 DIAGNOSIS — R2689 Other abnormalities of gait and mobility: Secondary | ICD-10-CM | POA: Diagnosis not present

## 2018-01-05 DIAGNOSIS — M6281 Muscle weakness (generalized): Secondary | ICD-10-CM | POA: Diagnosis not present

## 2018-01-05 DIAGNOSIS — G309 Alzheimer's disease, unspecified: Secondary | ICD-10-CM | POA: Diagnosis not present

## 2018-01-05 DIAGNOSIS — R262 Difficulty in walking, not elsewhere classified: Secondary | ICD-10-CM | POA: Diagnosis not present

## 2018-01-05 DIAGNOSIS — R278 Other lack of coordination: Secondary | ICD-10-CM | POA: Diagnosis not present

## 2018-01-05 DIAGNOSIS — R062 Wheezing: Secondary | ICD-10-CM | POA: Diagnosis not present

## 2018-01-05 DIAGNOSIS — R2689 Other abnormalities of gait and mobility: Secondary | ICD-10-CM | POA: Diagnosis not present

## 2018-01-06 DIAGNOSIS — M6281 Muscle weakness (generalized): Secondary | ICD-10-CM | POA: Diagnosis not present

## 2018-01-06 DIAGNOSIS — R062 Wheezing: Secondary | ICD-10-CM | POA: Diagnosis not present

## 2018-01-06 DIAGNOSIS — R2689 Other abnormalities of gait and mobility: Secondary | ICD-10-CM | POA: Diagnosis not present

## 2018-01-06 DIAGNOSIS — G309 Alzheimer's disease, unspecified: Secondary | ICD-10-CM | POA: Diagnosis not present

## 2018-01-06 DIAGNOSIS — R262 Difficulty in walking, not elsewhere classified: Secondary | ICD-10-CM | POA: Diagnosis not present

## 2018-01-06 DIAGNOSIS — R278 Other lack of coordination: Secondary | ICD-10-CM | POA: Diagnosis not present

## 2018-01-07 DIAGNOSIS — M6281 Muscle weakness (generalized): Secondary | ICD-10-CM | POA: Diagnosis not present

## 2018-01-07 DIAGNOSIS — R278 Other lack of coordination: Secondary | ICD-10-CM | POA: Diagnosis not present

## 2018-01-07 DIAGNOSIS — G309 Alzheimer's disease, unspecified: Secondary | ICD-10-CM | POA: Diagnosis not present

## 2018-01-07 DIAGNOSIS — R062 Wheezing: Secondary | ICD-10-CM | POA: Diagnosis not present

## 2018-01-07 DIAGNOSIS — R2689 Other abnormalities of gait and mobility: Secondary | ICD-10-CM | POA: Diagnosis not present

## 2018-01-07 DIAGNOSIS — R262 Difficulty in walking, not elsewhere classified: Secondary | ICD-10-CM | POA: Diagnosis not present

## 2018-01-10 DIAGNOSIS — R278 Other lack of coordination: Secondary | ICD-10-CM | POA: Diagnosis not present

## 2018-01-10 DIAGNOSIS — M6281 Muscle weakness (generalized): Secondary | ICD-10-CM | POA: Diagnosis not present

## 2018-01-10 DIAGNOSIS — R2689 Other abnormalities of gait and mobility: Secondary | ICD-10-CM | POA: Diagnosis not present

## 2018-01-10 DIAGNOSIS — R262 Difficulty in walking, not elsewhere classified: Secondary | ICD-10-CM | POA: Diagnosis not present

## 2018-01-10 DIAGNOSIS — G309 Alzheimer's disease, unspecified: Secondary | ICD-10-CM | POA: Diagnosis not present

## 2018-01-10 DIAGNOSIS — R062 Wheezing: Secondary | ICD-10-CM | POA: Diagnosis not present

## 2018-01-11 DIAGNOSIS — G309 Alzheimer's disease, unspecified: Secondary | ICD-10-CM | POA: Diagnosis not present

## 2018-01-11 DIAGNOSIS — R278 Other lack of coordination: Secondary | ICD-10-CM | POA: Diagnosis not present

## 2018-01-11 DIAGNOSIS — M6281 Muscle weakness (generalized): Secondary | ICD-10-CM | POA: Diagnosis not present

## 2018-01-11 DIAGNOSIS — R262 Difficulty in walking, not elsewhere classified: Secondary | ICD-10-CM | POA: Diagnosis not present

## 2018-01-11 DIAGNOSIS — R2689 Other abnormalities of gait and mobility: Secondary | ICD-10-CM | POA: Diagnosis not present

## 2018-01-11 DIAGNOSIS — R062 Wheezing: Secondary | ICD-10-CM | POA: Diagnosis not present

## 2018-01-12 DIAGNOSIS — R2689 Other abnormalities of gait and mobility: Secondary | ICD-10-CM | POA: Diagnosis not present

## 2018-01-12 DIAGNOSIS — R262 Difficulty in walking, not elsewhere classified: Secondary | ICD-10-CM | POA: Diagnosis not present

## 2018-01-12 DIAGNOSIS — R278 Other lack of coordination: Secondary | ICD-10-CM | POA: Diagnosis not present

## 2018-01-12 DIAGNOSIS — I739 Peripheral vascular disease, unspecified: Secondary | ICD-10-CM | POA: Diagnosis not present

## 2018-01-12 DIAGNOSIS — B351 Tinea unguium: Secondary | ICD-10-CM | POA: Diagnosis not present

## 2018-01-12 DIAGNOSIS — G309 Alzheimer's disease, unspecified: Secondary | ICD-10-CM | POA: Diagnosis not present

## 2018-01-12 DIAGNOSIS — M6281 Muscle weakness (generalized): Secondary | ICD-10-CM | POA: Diagnosis not present

## 2018-01-12 DIAGNOSIS — Q845 Enlarged and hypertrophic nails: Secondary | ICD-10-CM | POA: Diagnosis not present

## 2018-01-12 DIAGNOSIS — G301 Alzheimer's disease with late onset: Secondary | ICD-10-CM | POA: Diagnosis not present

## 2018-01-12 DIAGNOSIS — R062 Wheezing: Secondary | ICD-10-CM | POA: Diagnosis not present

## 2018-01-12 DIAGNOSIS — L603 Nail dystrophy: Secondary | ICD-10-CM | POA: Diagnosis not present

## 2018-01-12 DIAGNOSIS — F0280 Dementia in other diseases classified elsewhere without behavioral disturbance: Secondary | ICD-10-CM | POA: Diagnosis not present

## 2018-01-26 DIAGNOSIS — M25569 Pain in unspecified knee: Secondary | ICD-10-CM | POA: Diagnosis not present

## 2018-01-26 DIAGNOSIS — R05 Cough: Secondary | ICD-10-CM | POA: Diagnosis not present

## 2018-01-26 DIAGNOSIS — K219 Gastro-esophageal reflux disease without esophagitis: Secondary | ICD-10-CM | POA: Diagnosis not present

## 2018-01-26 DIAGNOSIS — M1000 Idiopathic gout, unspecified site: Secondary | ICD-10-CM | POA: Diagnosis not present

## 2018-01-26 DIAGNOSIS — R52 Pain, unspecified: Secondary | ICD-10-CM | POA: Diagnosis not present

## 2018-01-26 DIAGNOSIS — E569 Vitamin deficiency, unspecified: Secondary | ICD-10-CM | POA: Diagnosis not present

## 2018-01-26 DIAGNOSIS — N189 Chronic kidney disease, unspecified: Secondary | ICD-10-CM | POA: Diagnosis not present

## 2018-01-26 DIAGNOSIS — M6281 Muscle weakness (generalized): Secondary | ICD-10-CM | POA: Diagnosis not present

## 2018-01-26 DIAGNOSIS — K5900 Constipation, unspecified: Secondary | ICD-10-CM | POA: Diagnosis not present

## 2018-01-26 DIAGNOSIS — Z7409 Other reduced mobility: Secondary | ICD-10-CM | POA: Diagnosis not present

## 2018-01-26 DIAGNOSIS — R197 Diarrhea, unspecified: Secondary | ICD-10-CM | POA: Diagnosis not present

## 2018-01-26 DIAGNOSIS — R634 Abnormal weight loss: Secondary | ICD-10-CM | POA: Diagnosis not present

## 2018-01-27 DIAGNOSIS — E559 Vitamin D deficiency, unspecified: Secondary | ICD-10-CM | POA: Diagnosis not present

## 2018-01-27 DIAGNOSIS — R0602 Shortness of breath: Secondary | ICD-10-CM | POA: Diagnosis not present

## 2018-01-27 DIAGNOSIS — E039 Hypothyroidism, unspecified: Secondary | ICD-10-CM | POA: Diagnosis not present

## 2018-01-27 DIAGNOSIS — I10 Essential (primary) hypertension: Secondary | ICD-10-CM | POA: Diagnosis not present

## 2018-01-27 DIAGNOSIS — D649 Anemia, unspecified: Secondary | ICD-10-CM | POA: Diagnosis not present

## 2018-01-27 DIAGNOSIS — I509 Heart failure, unspecified: Secondary | ICD-10-CM | POA: Diagnosis not present

## 2018-02-16 DIAGNOSIS — H34812 Central retinal vein occlusion, left eye, with macular edema: Secondary | ICD-10-CM | POA: Diagnosis not present

## 2018-02-28 DIAGNOSIS — R319 Hematuria, unspecified: Secondary | ICD-10-CM | POA: Diagnosis not present

## 2018-02-28 DIAGNOSIS — N390 Urinary tract infection, site not specified: Secondary | ICD-10-CM | POA: Diagnosis not present

## 2018-03-28 ENCOUNTER — Encounter: Payer: Self-pay | Admitting: Pulmonary Disease

## 2018-03-28 ENCOUNTER — Ambulatory Visit (INDEPENDENT_AMBULATORY_CARE_PROVIDER_SITE_OTHER)
Admission: RE | Admit: 2018-03-28 | Discharge: 2018-03-28 | Disposition: A | Discharge status: Home / Self Care | Payer: Medicare Other | Source: Ambulatory Visit | Attending: Pulmonary Disease | Admitting: Pulmonary Disease

## 2018-03-28 ENCOUNTER — Ambulatory Visit (INDEPENDENT_AMBULATORY_CARE_PROVIDER_SITE_OTHER): Payer: Medicare Other | Admitting: Pulmonary Disease

## 2018-03-28 DIAGNOSIS — F0280 Dementia in other diseases classified elsewhere without behavioral disturbance: Secondary | ICD-10-CM

## 2018-03-28 DIAGNOSIS — G309 Alzheimer's disease, unspecified: Secondary | ICD-10-CM

## 2018-03-28 DIAGNOSIS — J209 Acute bronchitis, unspecified: Secondary | ICD-10-CM

## 2018-03-28 DIAGNOSIS — G301 Alzheimer's disease with late onset: Secondary | ICD-10-CM

## 2018-03-28 LAB — CBC WITH DIFFERENTIAL/PLATELET
Basophils Absolute: 0.1 10*3/uL (ref 0.0–0.1)
Basophils Relative: 0.4 % (ref 0.0–3.0)
Eosinophils Absolute: 0.7 10*3/uL (ref 0.0–0.7)
Eosinophils Relative: 4.8 % (ref 0.0–5.0)
HCT: 39.3 % (ref 36.0–46.0)
Hemoglobin: 13 g/dL (ref 12.0–15.0)
Lymphocytes Relative: 12.2 % (ref 12.0–46.0)
Lymphs Abs: 1.7 10*3/uL (ref 0.7–4.0)
MCHC: 33 g/dL (ref 30.0–36.0)
MCV: 89.7 fl (ref 78.0–100.0)
Monocytes Absolute: 1.4 10*3/uL — ABNORMAL HIGH (ref 0.1–1.0)
Monocytes Relative: 10 % (ref 3.0–12.0)
NEUTROS ABS: 10.1 10*3/uL — AB (ref 1.4–7.7)
NEUTROS PCT: 72.6 % (ref 43.0–77.0)
PLATELETS: 306 10*3/uL (ref 150.0–400.0)
RBC: 4.38 Mil/uL (ref 3.87–5.11)
RDW: 14.3 % (ref 11.5–15.5)
WBC: 14 10*3/uL — ABNORMAL HIGH (ref 4.0–10.5)

## 2018-03-28 LAB — BASIC METABOLIC PANEL
BUN: 19 mg/dL (ref 6–23)
CO2: 29 meq/L (ref 19–32)
Calcium: 9.7 mg/dL (ref 8.4–10.5)
Chloride: 99 mEq/L (ref 96–112)
Creatinine, Ser: 1.55 mg/dL — ABNORMAL HIGH (ref 0.40–1.20)
GFR: 31.17 mL/min — ABNORMAL LOW (ref >60.00)
GLUCOSE: 98 mg/dL (ref 70–99)
Potassium: 3.8 mEq/L (ref 3.5–5.1)
Sodium: 138 mEq/L (ref 135–145)

## 2018-03-28 MED ORDER — PREDNISONE 10 MG PO TABS: ORAL_TABLET | ORAL | 0 refills | Status: AC

## 2018-03-28 MED ORDER — CEFDINIR 300 MG PO CAPS: 300.0000 mg | ORAL_CAPSULE | Freq: Two times a day (BID) | ORAL | 0 refills | Status: AC

## 2018-03-28 NOTE — Assessment & Plan Note (Addendum)
seem to have repeated episodes of acute bronchitis/pneumonia Some concern for aspiration. Hypoxia on room air is concerning but not to the point of requiring oxygen yet  Chest x-ray  Check CBC with differential  Prednisone 10 mg tabs Take 4 tabs  daily with food x 4 days, then 3 tabs daily x 4 days, then 2 tabs daily x 4 days, then 1 tab daily x4 days then stop. #40  Change Pulmicort to 0.5 mg nebs twice daily. Duo nebs 3 times daily and every 4 hours as needed  Omnicef 300 mg twice daily for 7 days

## 2018-03-28 NOTE — Assessment & Plan Note (Signed)
Have to be concerned about aspiration in this setting. We will wait until she is clinically improved and then discuss swallow evaluation

## 2018-03-28 NOTE — Progress Notes (Signed)
Subjective:    Patient ID: Michele Maxwell, female    DOB: 10/02/1922, 85 y.o.   MRN: [id number]  HPI  Chief Complaint  Patient presents with  . Pulm Consult    Referred by [facility] for sob and bronchitis.     85 year old resident of [facility] presents for evaluation of shortness of breath and wheezing.  She is accompanied by her daughter-in-law [name] who provides most of the history patient herself has Alzheimer's and severe deafness in spite of cochlear implant.  She used to live in [place] until 2011 when she moved to [place].  She ambulates with a walker but is limited and needs assistance with activities of daily living.  There is no episode of witnessed aspiration or swallowing issues.  She quit smoking in the 60s, less than 20 pack years but has had respiratory issues for the last 10 years with at least 1-2 colds every winter that are difficult to shake off. It is unclear when this particular episode exactly started and whether there was a URI symptoms at onset but she has progressed to significant wheezing and coughing.  She is unable to tell me color of sputum.  I note that she was started on clindamycin and on albuterol nebs.  She is also on Pulmicort Flexhaler, but daughter-in-law seems to feel that she is unable to take any kind of inhaler medication.  Chest x-ray from 03/17/2018 did not show any infiltrate or effusion. Labs on 1/25 show normal glucose and BUN/creatinine of 20/1.4  Past Medical History:  Diagnosis Date  . Asthma    Dementia, deafness, polymyalgia rheumatica, steroids were tapered and she has been off steroids over the past several years  Past surgical history-hysterectomy, breast biopsy, appendectomy   No known drug allergies  Family history -son is in good health  Social History   Socioeconomic History  . Marital status: Widowed    Spouse name: Not on file  . Number of children: Not on file  . Years of education: Not on file  .  Highest education level: Not on file  Occupational History  . Not on file  Social Needs  . Financial resource strain: Not on file  . Food insecurity:    Worry: Not on file    Inability: Not on file  . Transportation needs:    Medical: Not on file    Non-medical: Not on file  Tobacco Use  . Smoking status: Never Smoker  . Smokeless tobacco: Never Used  Substance and Sexual Activity  . Alcohol use: No  . Drug use: Not on file  . Sexual activity: Not on file  Lifestyle  . Physical activity:    Days per week: Not on file    Minutes per session: Not on file  . Stress: Not on file  Relationships  . Social connections:    Talks on phone: Not on file    Gets together: Not on file    Attends religious service: Not on file    Active member of club or organization: Not on file    Attends meetings of clubs or organizations: Not on file    Relationship status: Not on file  . Intimate partner violence:    Fear of current or ex partner: Not on file    Emotionally abused: Not on file    Physically abused: Not on file    Forced sexual activity: Not on file  Other Topics Concern  . Not on file  Social  History Narrative  . Not on file      Review of Systems Patient denies any complaints but is noted to cough intermittently without any sputum production  Constitutional: negative for anorexia, fevers and sweats  Eyes: negative for irritation, redness and visual disturbance  Ears, nose, mouth, throat, and face: negative for earaches, epistaxis, nasal congestion and sore throat    Cardiovascular: negative for chest pain,  lower extremity edema, orthopnea, palpitations and syncope  Gastrointestinal: negative for abdominal pain, constipation, diarrhea, melena, nausea and vomiting  Genitourinary:negative for dysuria, frequency and hematuria  Hematologic/lymphatic: negative for bleeding, easy bruising and lymphadenopathy  Musculoskeletal:negative for arthralgias, muscle weakness and stiff  joints  Neurological: negative for coordination problems, gait problems, headaches and weakness  Endocrine: negative for diabetic symptoms including polydipsia, polyuria and weight loss     Objective:   Physical Exam  Gen. Pleasant, elderly, in no distress, normal affect, although saturation 91% on room air ENT - no pallor,icterus, no post nasal drip Neck: No JVD, no thyromegaly, no carotid bruits Lungs: no use of accessory muscles, no dullness to percussion, bilateral inspiratory and expiratory rhonchi  Cardiovascular: Rhythm regular, heart sounds  normal, no murmurs or gallops, no peripheral edema Abdomen: soft and non-tender, no hepatosplenomegaly, BS normal. Musculoskeletal: No deformities, no cyanosis or clubbing Neuro:  alert, non focal        Assessment & Plan:

## 2018-03-28 NOTE — Addendum Note (Signed)
Addended by: Demetrio Lapping E on: 03/28/2018 11:42 AM   Modules accepted: Orders

## 2018-03-28 NOTE — Patient Instructions (Signed)
You seem to have repeated episodes of acute bronchitis/pneumonia Some concern for aspiration.  Chest x-ray and blood work today  Prednisone 10 mg tabs Take 4 tabs  daily with food x 4 days, then 3 tabs daily x 4 days, then 2 tabs daily x 4 days, then 1 tab daily x4 days then stop. #40  Change Pulmicort to 0.5 mg nebs twice daily. Duo nebs 3 times daily and every 4 hours as needed  Omnicef 300 mg twice daily for 7 days

## 2018-04-01 ENCOUNTER — Telehealth: Payer: Self-pay | Admitting: Primary Care

## 2018-04-01 NOTE — Telephone Encounter (Signed)
Spoke with patient son and advised of xray results and for his mother to take the medication as directed. Confirmed she already has appointment for follow up on 04/05/18. Nothing further needed at this time.

## 2018-04-01 NOTE — Telephone Encounter (Signed)
Called and left message on VM to call back for CXR results and recommendations.

## 2018-04-01 NOTE — Telephone Encounter (Signed)
Patients daughter in law Wanda Plump returning phone call.  Phone number is [phone].

## 2018-04-05 ENCOUNTER — Encounter: Payer: Self-pay | Admitting: Primary Care

## 2018-04-05 ENCOUNTER — Ambulatory Visit (INDEPENDENT_AMBULATORY_CARE_PROVIDER_SITE_OTHER): Payer: Medicare Other | Admitting: Primary Care

## 2018-04-05 ENCOUNTER — Telehealth: Payer: Self-pay | Admitting: Primary Care

## 2018-04-05 VITALS — BP 118/68 | HR 73 | Ht 65.0 in | Wt 143.6 lb

## 2018-04-05 DIAGNOSIS — J189 Pneumonia, unspecified organism: Secondary | ICD-10-CM

## 2018-04-05 DIAGNOSIS — J181 Lobar pneumonia, unspecified organism: Secondary | ICD-10-CM

## 2018-04-05 DIAGNOSIS — J209 Acute bronchitis, unspecified: Secondary | ICD-10-CM | POA: Diagnosis not present

## 2018-04-05 NOTE — Telephone Encounter (Signed)
Returned call to [place], [facility] rehab.  She was going to schedule barium swallow with patient.  She wanted to know if Patient lived at [facility].  Per 04/05/18, OV note from Ames Dura, NP, Patient is a resident of [facility].

## 2018-04-05 NOTE — Assessment & Plan Note (Addendum)
-   Lives in nursing home. CXR showed focal opacity LLL concerning PNA with elevated WBC 14. Treated with Omnicef 300mg  twice daily x 7 days. Clinically improving, LS with persistent coarse rales left lower base. O2 saturation 98% RA, afebrile.  - Plan complete prednisone taper as prescribed and advised Mucinex twice daily x 1-2 week - Needs follow up CXR in 3-4 weeks and barium swallow concern for aspiration - Consider spirometry at next office visit

## 2018-04-05 NOTE — Progress Notes (Signed)
 @Patient  ID: Michele SchimkeAnn A Meissner, female    DOB: 01/23/1923, 85 y.o.   MRN: [id number]  Chief Complaint  Patient presents with  . Follow-up    pt doing well, no new or worsening symptoms, cough    Referring provider: Merlene LaughterStoneking, Hal, MD  Synopsis: 85 year old resident of [facility] nursing [facility] presents for evaluation of shortness of breath and wheezing. Accompanied by her daughter-in-law Michele Maxwell who provides most of the history, patient herself has Alzheimer's and severe deafness in spite of cochlear implant. She used to live in [place] until 2011 when she moved to [place]. She ambulates with a walker but is limited and needs assistance with activities of daily living. There is no episode of witnessed aspiration or swallowing issues.   HPI: 85 year old female, never smoked. PMH significant for alzheimer's dementia, acute bronchitis. Patient of Dr. Vassie LollAlva, seen for initial consult on 03/28/18 for recurrent bronchitis/pneumonia, concern for aspiration. Rx prednisone taper, Omnicef x 1 week, increased Pulmicort twice daily and Duonebs q 4hrs prn. Plan to wait until clinically improved and then discuss possible swallow evaluation. CXR showed peribronchial thickening and interstitial prominence, likely bronchitis changes. Question focal opacity LLL concerning PNA. WBC 14. Eos absolute 700.  04/05/2018 Patient presents today for 1 week follow-up. Accompanied by her son.  He reports that she is doing much better. She has not had much cough, if any. Completed oral Omnicef course and should have 1 week left of prednisone taper. Eating and drinking normally. No fever or shortness of breath.    No Known Allergies  Immunization History  Administered Date(s) Administered  . Influenza Split 11/03/2017    Past Medical History:  Diagnosis Date  . Asthma     Tobacco History: Social History   Tobacco Use  Smoking Status Never Smoker  Smokeless Tobacco Never Used   Counseling given: Not  Answered   Outpatient Medications Prior to Visit  Medication Sig Dispense Refill  . acetaminophen (TYLENOL) 325 MG tablet Take 650 mg by mouth every 6 (six) hours as needed.    Marland Kitchen. albuterol (PROVENTIL HFA;VENTOLIN HFA) 108 (90 Base) MCG/ACT inhaler Inhale 2 puffs into the lungs every 6 (six) hours as needed for wheezing or shortness of breath.    . allopurinol (ZYLOPRIM) 100 MG tablet Take 100 mg by mouth daily.    Marland Kitchen. aluminum-magnesium hydroxide-simethicone (MAALOX) 200-200-20 MG/5ML SUSP Take 30 mLs by mouth as needed.    . benzonatate (TESSALON) 200 MG capsule Take 200 mg by mouth 3 (three) times daily as needed for cough.    . budesonide (PULMICORT) 180 MCG/ACT inhaler Inhale into the lungs daily.    . Calcium Carbonate Antacid 600 MG chewable tablet Chew 600 mg by mouth.    . cefdinir (OMNICEF) 300 MG capsule Take 1 capsule (300 mg total) by mouth 2 (two) times daily. 14 capsule 0  . donepezil (ARICEPT) 10 MG tablet Take 10 mg by mouth at bedtime.    . furosemide (LASIX) 20 MG tablet Take 20 mg by mouth.    Marland Kitchen. guaiFENesin (MUCINEX) 600 MG 12 hr tablet Take by mouth 2 (two) times daily.    Marland Kitchen. ipratropium-albuterol (DUONEB) 0.5-2.5 (3) MG/3ML SOLN Take 3 mLs by nebulization every 6 (six) hours as needed.    . lidocaine (LMX) 4 % cream Apply 1 application topically as needed. Apply to upper thigh every 8 hours    . Multiple Vitamins-Minerals (CERTAVITE SENIOR/ANTIOXIDANT PO) Take by mouth.    . mupirocin ointment (BACTROBAN) 2 %  Place 1 application into the nose 2 (two) times daily. Apply to left index finger.    . polyethylene glycol (MIRALAX / GLYCOLAX) packet Take 17 g by mouth as needed.    . predniSONE (DELTASONE) 10 MG tablet Take 4 tabs by mouth once daily x4 days, then 3 tabs x4 days, 2 tabs x4 days, 1 tab x4 days and stop. 40 tablet 0  . Probiotic Product (RISA-BID PROBIOTIC PO) Take 1 tablet by mouth daily.    Marland Kitchen senna (SENOKOT) 8.6 MG TABS tablet Take 1 tablet by mouth daily.    .  Turmeric POWD Take by mouth.      No facility-administered medications prior to visit.     Review of Systems  Review of Systems  Constitutional: Negative.   HENT: Negative.   Respiratory: Positive for cough. Negative for shortness of breath and wheezing.   Cardiovascular: Negative.     Physical Exam  BP 118/68 (BP Location: Right Arm, Cuff Size: Normal)   Pulse 73   Ht 5\' 5"  (1.651 m)   Wt 143 lb 9.6 oz (65.1 kg)   SpO2 98%   BMI 23.90 kg/m  Physical Exam Constitutional:      General: She is not in acute distress.    Appearance: Normal appearance. She is normal weight. She is not ill-appearing.  HENT:     Head: Normocephalic and atraumatic.     Right Ear: Tympanic membrane normal.     Left Ear: Tympanic membrane normal.     Ears:     Comments: HOH    Mouth/Throat:     Mouth: Mucous membranes are moist.     Pharynx: Oropharynx is clear.  Neck:     Musculoskeletal: Normal range of motion and neck supple.  Cardiovascular:     Rate and Rhythm: Normal rate and regular rhythm.  Pulmonary:     Effort: No respiratory distress.     Breath sounds: Rales present. No wheezing.     Comments: Coarse crackles LLL Musculoskeletal: Normal range of motion.  Skin:    General: Skin is warm and dry.  Neurological:     Mental Status: She is alert.  Psychiatric:        Mood and Affect: Mood normal.     Comments: Hx dementia      Lab Results:  CBC    Component Value Date/Time   WBC 14.0 (H) 03/28/2018 1143   RBC 4.38 03/28/2018 1143   HGB 13.0 03/28/2018 1143   HCT 39.3 03/28/2018 1143   PLT 306.0 03/28/2018 1143   MCV 89.7 03/28/2018 1143   MCH 30.4 05/15/2013 1007   MCHC 33.0 03/28/2018 1143   RDW 14.3 03/28/2018 1143   LYMPHSABS 1.7 03/28/2018 1143   MONOABS 1.4 (H) 03/28/2018 1143   EOSABS 0.7 03/28/2018 1143   BASOSABS 0.1 03/28/2018 1143    BMET    Component Value Date/Time   NA 138 03/28/2018 1143   K 3.8 03/28/2018 1143   CL 99 03/28/2018 1143   CO2 29  03/28/2018 1143   GLUCOSE 98 03/28/2018 1143   BUN 19 03/28/2018 1143   CREATININE 1.55 (H) 03/28/2018 1143   CALCIUM 9.7 03/28/2018 1143   GFRNONAA 37 (L) 05/15/2013 1007   GFRAA 43 (L) 05/15/2013 1007    BNP No results found for: BNP  ProBNP No results found for: PROBNP  Imaging: Dg Chest 2 View  Result Date: 03/28/2018 CLINICAL DATA:  Bronchitis, follow-up.  Productive cough. EXAM: CHEST - 2 VIEW  COMPARISON:  01/23/2014 FINDINGS: Peribronchial thickening and interstitial prominence, progressed since 2015. Or confluent opacity is noted at the left lung base concerning for left lower lobe pneumonia. No effusions or acute bony abnormality. IMPRESSION: Peribronchial thickening and interstitial prominence, likely bronchitic changes. Question focal opacity in the left lower lobe concerning for pneumonia. Electronically Signed   By: Charlett NoseKevin  Dover M.D.   On: 03/28/2018 16:09     Assessment & Plan:   Pneumonia of left lower lobe due to infectious organism [facility](HCC) - Lives in nursing home. CXR showed focal opacity LLL concerning PNA with elevated WBC 14. Treated with Omnicef 300mg  twice daily x 7 days. Clinically improving, LS with persistent coarse rales left lower base. O2 saturation 98% RA, afebrile.  - Plan complete prednisone taper as prescribed and advised Mucinex twice daily x 1-2 week - Needs follow up CXR in 3-4 weeks and barium swallow concern for aspiration - Consider spirometry at next office visit    Glenford BayleyElizabeth W Taytum Scheck, NP 04/05/2018

## 2018-04-05 NOTE — Patient Instructions (Addendum)
Recent left lower lobe pneumonia, clinically improving   Recommendations: Oral hydration ( 6-8 8oz glass of fluid daily) Encourage high protein diet  Tylenol 650mg  every 4 hours as needed for fever >100  Mucinex twice daily for 1-2 weeks   Orders: Needs barium swallow re: ? Aspiration, recent pneumonia   Follow-up: 3-4 weeks with CXR prior (Dr. Vassie Loll or NP fine)

## 2018-05-10 ENCOUNTER — Ambulatory Visit (INDEPENDENT_AMBULATORY_CARE_PROVIDER_SITE_OTHER): Payer: Medicare Other | Admitting: Pulmonary Disease

## 2018-05-10 ENCOUNTER — Ambulatory Visit (INDEPENDENT_AMBULATORY_CARE_PROVIDER_SITE_OTHER)
Admission: RE | Admit: 2018-05-10 | Discharge: 2018-05-10 | Disposition: A | Discharge status: Home / Self Care | Payer: Medicare Other | Source: Ambulatory Visit | Attending: Pulmonary Disease | Admitting: Pulmonary Disease

## 2018-05-10 ENCOUNTER — Ambulatory Visit: Payer: Medicare Other | Admitting: Pulmonary Disease

## 2018-05-10 ENCOUNTER — Encounter: Payer: Self-pay | Admitting: Pulmonary Disease

## 2018-05-10 VITALS — BP 112/70 | HR 71 | Ht 65.0 in

## 2018-05-10 DIAGNOSIS — J181 Lobar pneumonia, unspecified organism: Secondary | ICD-10-CM

## 2018-05-10 DIAGNOSIS — J209 Acute bronchitis, unspecified: Secondary | ICD-10-CM

## 2018-05-10 DIAGNOSIS — J9611 Chronic respiratory failure with hypoxia: Secondary | ICD-10-CM

## 2018-05-10 DIAGNOSIS — J189 Pneumonia, unspecified organism: Secondary | ICD-10-CM

## 2018-05-10 NOTE — Patient Instructions (Signed)
CXR today.  

## 2018-05-10 NOTE — Progress Notes (Signed)
   Subjective:    Patient ID: Michele Maxwell, female    DOB: 05-Mar-1922, 85 y.o.   MRN: [id number]  HPI  85 year old [facility], never smoker.  PMH significant for alzheimer's dementia, very hard of hearing  She was seen 03/2018 for left lower lobe pneumonia, suspected aspiration CXR 1/27 LLL pneumonia , also interstitial prominence. CT chest 04/2013 does not show any evidence of ILD  All her medications were converted to nebs because her MDI technique was very poor and she is decreased cognition.  She has improved significantly and is breathing much better She unfortunately had a fall and has a large pelvic bruise, no broken bones, has been using wheelchair more last few days.  Accompanied by her daughter-in-law [name] today  Modified barium swallow was suggested but deferred  Oxygen saturation is 91% but she appears comfortable  DNR noted  Chest x-ray was obtained and reviewed today which shows improved left lower lobe infiltrate and persistent interstitial prominence  Past Medical History:  Diagnosis Date  . Asthma     Review of Systems neg for any significant sore throat, dysphagia, itching, sneezing, nasal congestion or excess/ purulent secretions, fever, chills, sweats, unintended wt loss, pleuritic or exertional cp, hempoptysis, orthopnea pnd or change in chronic leg swelling. Also denies presyncope, palpitations, heartburn, abdominal pain, nausea, vomiting, diarrhea or change in bowel or urinary habits, dysuria,hematuria, rash, arthralgias, visual complaints, headache, numbness weakness or ataxia.     Objective:   Physical Exam  Gen. Pleasant, well-nourished, lelderly, in no distress, normal affect ENT - no pallor,icterus, no post nasal drip Neck: No JVD, no thyromegaly, no carotid bruits Lungs: no use of accessory muscles, no dullness to percussion, bibasal rales or rhonchi  Cardiovascular: Rhythm regular, heart sounds  normal, no murmurs or gallops, no peripheral edema Abdomen:  soft and non-tender, no hepatosplenomegaly, BS normal. Musculoskeletal: No deformities, no cyanosis or clubbing Neuro:  alert, non focal       Assessment & Plan:

## 2018-05-10 NOTE — Assessment & Plan Note (Signed)
Chest x-ray reviewed today which shows improved pneumonia. I do suspect recurrent aspiration and offered modified barium swallow, discussed with daughter-in-law felt that this would worsen her quality of life and we decided to defer at this point

## 2018-05-10 NOTE — Assessment & Plan Note (Signed)
We discussed further work-up including CT chest to look for pulmonary fibrosis given interstitial prominence on chest x-ray but decided to defer this given her overall goals of care. DNR noted. Asked nursing home to check her saturations daily and put on oxygen if saturations less than 88%

## 2018-08-30 ENCOUNTER — Non-Acute Institutional Stay: Payer: Self-pay | Admitting: Internal Medicine

## 2019-05-17 ENCOUNTER — Non-Acute Institutional Stay: Payer: Self-pay | Admitting: Internal Medicine

## 2019-07-25 ENCOUNTER — Encounter (HOSPITAL_COMMUNITY): Payer: Self-pay

## 2019-07-25 ENCOUNTER — Emergency Department (HOSPITAL_COMMUNITY)
Admission: EM | Admit: 2019-07-25 | Discharge: 2019-07-25 | Disposition: A | Discharge status: Skilled Nursing Facility | Payer: Medicare Other | Attending: Emergency Medicine | Admitting: Emergency Medicine

## 2019-07-25 ENCOUNTER — Other Ambulatory Visit: Payer: Self-pay

## 2019-07-25 ENCOUNTER — Emergency Department (HOSPITAL_COMMUNITY): Payer: Medicare Other

## 2019-07-25 DIAGNOSIS — Y999 Unspecified external cause status: Secondary | ICD-10-CM | POA: Diagnosis not present

## 2019-07-25 DIAGNOSIS — M79601 Pain in right arm: Secondary | ICD-10-CM | POA: Insufficient documentation

## 2019-07-25 DIAGNOSIS — M79621 Pain in right upper arm: Secondary | ICD-10-CM | POA: Insufficient documentation

## 2019-07-25 DIAGNOSIS — M25521 Pain in right elbow: Secondary | ICD-10-CM | POA: Diagnosis not present

## 2019-07-25 DIAGNOSIS — G309 Alzheimer's disease, unspecified: Secondary | ICD-10-CM | POA: Insufficient documentation

## 2019-07-25 DIAGNOSIS — Y92129 Unspecified place in nursing home as the place of occurrence of the external cause: Secondary | ICD-10-CM | POA: Insufficient documentation

## 2019-07-25 DIAGNOSIS — W19XXXA Unspecified fall, initial encounter: Secondary | ICD-10-CM | POA: Insufficient documentation

## 2019-07-25 DIAGNOSIS — Y9389 Activity, other specified: Secondary | ICD-10-CM | POA: Diagnosis not present

## 2019-07-25 HISTORY — DX: Unspecified dementia, unspecified severity, without behavioral disturbance, psychotic disturbance, mood disturbance, and anxiety: F03.90

## 2019-07-25 NOTE — ED Notes (Signed)
Due to delay in [facility] transport, family chose to transport pt back to [place] facility by POV.

## 2019-07-25 NOTE — ED Notes (Signed)
[facility] called for transport back to [facility] facility.

## 2019-07-25 NOTE — ED Provider Notes (Signed)
[facility]-EMERGENCY DEPT Provider Note   CSN: [id number] Arrival date & time: 07/25/19  1352     History Chief Complaint  Patient presents with  . Shoulder Injury    Michele Maxwell is a 86 y.o. female.  HPI     Patient presents from nursing facility with concern of shoulder pain.  Patient has dementia, and very poor hearing, level 5 caveat.  Not she is initially alone, but is eventually joined by her daughter in law who does provide additional details of the history. It seems as though the patient may have had a fall about 1 week ago, but no fall was noted subsequent to that.  However, she seemingly had increasing discomfort in her right arm, and had x-rays performed at the facility.  These reportedly demonstrated proximal right humerus fracture, were negative for right wrist fracture. No reported other injuries, no reported change in interactivity or behavior, no other reported changes. Level 5 caveat secondary to dementia. Once the patient's daughter-in-law arrives she notes that the patient has had recent decline in her condition, and if she does have dementia, her cognitive condition is complicated by her very poor hearing, negligible on the right, requiring cochlear implant on the left, and this device is currently not working.   Past Medical History:  Diagnosis Date  . Asthma   . Dementia [facility])     Patient Active Problem List   Diagnosis Date Noted  . Chronic respiratory failure with hypoxia (HCC) 05/10/2018  . Pneumonia of left lower lobe due to infectious organism 04/05/2018  . Acute bronchitis 03/28/2018  . Alzheimer's dementia (HCC) 03/28/2018  . Nonallopathic lesion of sacral region 06/06/2013  . SI (sacroiliac) joint dysfunction 05/31/2013    History reviewed. No pertinent surgical history.   OB History   No obstetric history on file.     History reviewed. No pertinent family history.  Social History   Tobacco Use  . Smoking status:  Never Smoker  . Smokeless tobacco: Never Used  Substance Use Topics  . Alcohol use: No  . Drug use: Not on file    Home Medications Prior to Admission medications   Medication Sig Start Date End Date Taking? Authorizing Provider  allopurinol (ZYLOPRIM) 100 MG tablet Take 100 mg by mouth daily.   Yes [provider]  Calcium Carbonate Antacid 600 MG chewable tablet Chew 600 mg by mouth.   Yes [provider]  acetaminophen (TYLENOL) 325 MG tablet Take 650 mg by mouth every 6 (six) hours as needed.    [provider]  albuterol (PROVENTIL HFA;VENTOLIN HFA) 108 (90 Base) MCG/ACT inhaler Inhale 2 puffs into the lungs every 6 (six) hours as needed for wheezing or shortness of breath.    [provider]  aluminum-magnesium hydroxide-simethicone (MAALOX) 200-200-20 MG/5ML SUSP Take 30 mLs by mouth as needed.    [provider]  benzonatate (TESSALON) 200 MG capsule Take 200 mg by mouth 3 (three) times daily as needed for cough.    [provider]  budesonide (PULMICORT) 180 MCG/ACT inhaler Inhale into the lungs daily.    [provider]  cefdinir (OMNICEF) 300 MG capsule Take 1 capsule (300 mg total) by mouth 2 (two) times daily. 03/28/18   Oretha Milch, MD  donepezil (ARICEPT) 10 MG tablet Take 10 mg by mouth at bedtime.    [provider]  furosemide (LASIX) 20 MG tablet Take 20 mg by mouth.    [provider]  guaiFENesin (MUCINEX) 600 MG 12 hr tablet Take by mouth 2 (two) times daily.    [provider]  ipratropium-albuterol (DUONEB) 0.5-2.5 (3) MG/3ML SOLN Take 3 mLs by nebulization every 6 (six) hours as needed.    [provider]  lidocaine (LMX) 4 % cream Apply 1 application topically as needed. Apply to upper thigh every 8 hours    [provider]  Multiple Vitamins-Minerals (CERTAVITE SENIOR/ANTIOXIDANT PO) Take by mouth.    [provider]  mupirocin ointment (BACTROBAN)  2 % Place 1 application into the nose 2 (two) times daily. Apply to left index finger.    [provider]  polyethylene glycol (MIRALAX / GLYCOLAX) packet Take 17 g by mouth as needed.    [provider]  predniSONE (DELTASONE) 10 MG tablet Take 4 tabs by mouth once daily x4 days, then 3 tabs x4 days, 2 tabs x4 days, 1 tab x4 days and stop. 03/28/18   Rigoberto Noel, MD  Probiotic Product (RISA-BID PROBIOTIC PO) Take 1 tablet by mouth daily.    [provider]  senna (SENOKOT) 8.6 MG TABS tablet Take 1 tablet by mouth daily.    [provider]  Turmeric POWD Take by mouth.     [provider]    Allergies    Patient has no known allergies.  Review of Systems   Review of Systems  Unable to perform ROS: Dementia    Physical Exam Updated Vital Signs BP (!) 149/91 (BP Location: Left Arm)   Pulse 65   Temp 97.7 F (36.5 C) (Oral)   Resp 15   SpO2 97%   Physical Exam Vitals and nursing note reviewed.  Constitutional:      General: She is not in acute distress.    Appearance: She is well-developed.  HENT:     Head: Normocephalic and atraumatic.  Eyes:     Conjunctiva/sclera: Conjunctivae normal.  Cardiovascular:     Rate and Rhythm: Normal rate and regular rhythm.  Pulmonary:     Effort: Pulmonary effort is normal. No respiratory distress.     Breath sounds: Normal breath sounds. No stridor.  Abdominal:     General: There is no distension.  Skin:    General: Skin is warm and dry.  Neurological:     Mental Status: She is alert and oriented to person, place, and time.     Cranial Nerves: No cranial nerve deficit.     ED Results / Procedures / Treatments   Labs (all labs ordered are listed, but only abnormal results are displayed) Labs Reviewed - No data to display  EKG None  Radiology No results found.  Procedures Procedures (including critical care time)  Medications Ordered in ED Medications - No data to  display  ED Course  I have reviewed the triage vital signs and the nursing notes.  Pertinent labs & imaging results that were available during my care of the patient were reviewed by me and considered in my medical decision making (see chart for details).   After the initial evaluation on repeat evaluation the patient is now accompanied by her daughter in law. Daughter-in-law is a physical therapist at our institution.  Together we discussed the patient's presentation and reviewed initial x-ray of the shoulder.  This does not demonstrate fracture.  We did repeat evaluation of the patient herself, and the patient has no apparent limitation of range of motion of the left arm, nor tenderness palpation.  However, the patient  does seem to be hesitant to fully extend the right elbow and has some pain in the distal humerus.  No tenderness to palpation about the wrist, shoulder, though the patient is largely unwilling to move the shoulder. With this, patient will have x-ray of her elbow performed in addition to previously performed x-ray the shoulder.  4:20 PM Shoulder x-ray, elbow x-ray both reassuring, no fracture.  On repeat exam the patient is calm, in no distress, resting.  Then a lengthy conversation with the daughter-in-law about today's reassuring evaluation.  We discussed options for additional care including CT imaging, versus discharge with home monitoring.  This latter option seems preferable, and absent other reported complaints, hemodynamic instability, and with the patient's noted residence in a nursing facility, she was discharged in stable condition.  Final Clinical Impression(s) / ED Diagnoses Final diagnoses:  Right arm pain     Gerhard Munch, MD 07/25/19 1621

## 2019-07-25 NOTE — ED Notes (Signed)
ED Provider at bedside. 

## 2019-07-25 NOTE — ED Triage Notes (Addendum)
Pt arrives via EMS from [place]. Per EMS: Pt has complained of right shoulder pain for 2 days. Facility completed xrays and reports right shoulder fracture. Unknown cause. No documented falls. Pt has dementia at baseline. DNR at bedside. Pt is HOH

## 2019-07-25 NOTE — Discharge Instructions (Addendum)
As discussed, today's evaluation did not demonstrate a new broken bone in your shoulder or elbow.  You may have sustained a contusion, or bruise or possibly a sprain.  However, if your pain persists, or you develop new, or concerning changes, please be sure to return here or speak with your physician for further evaluation.

## 2020-02-03 ENCOUNTER — Other Ambulatory Visit: Payer: Self-pay

## 2020-02-03 ENCOUNTER — Emergency Department (HOSPITAL_COMMUNITY): Payer: Medicare Other

## 2020-02-03 ENCOUNTER — Encounter (HOSPITAL_COMMUNITY): Payer: Self-pay

## 2020-02-03 ENCOUNTER — Emergency Department (HOSPITAL_COMMUNITY)
Admission: EM | Admit: 2020-02-03 | Discharge: 2020-02-03 | Disposition: A | Discharge status: Home / Self Care | Payer: Medicare Other | Attending: Emergency Medicine | Admitting: Emergency Medicine

## 2020-02-03 DIAGNOSIS — F0390 Unspecified dementia without behavioral disturbance: Secondary | ICD-10-CM | POA: Diagnosis not present

## 2020-02-03 DIAGNOSIS — W19XXXA Unspecified fall, initial encounter: Secondary | ICD-10-CM | POA: Insufficient documentation

## 2020-02-03 DIAGNOSIS — Y92001 Dining room of unspecified non-institutional (private) residence as the place of occurrence of the external cause: Secondary | ICD-10-CM | POA: Diagnosis not present

## 2020-02-03 DIAGNOSIS — J45909 Unspecified asthma, uncomplicated: Secondary | ICD-10-CM | POA: Diagnosis not present

## 2020-02-03 DIAGNOSIS — G589 Mononeuropathy, unspecified: Secondary | ICD-10-CM | POA: Diagnosis not present

## 2020-02-03 DIAGNOSIS — S0990XA Unspecified injury of head, initial encounter: Secondary | ICD-10-CM | POA: Diagnosis not present

## 2020-02-03 DIAGNOSIS — Z7901 Long term (current) use of anticoagulants: Secondary | ICD-10-CM | POA: Diagnosis not present

## 2020-02-03 NOTE — Discharge Instructions (Signed)
As discussed, your evaluation today has been largely reassuring.  But, it is important that you monitor your condition carefully, and do not hesitate to return to the ED if you develop new, or concerning changes in your condition. ? ?Otherwise, please follow-up with your physician for appropriate ongoing care. ? ?

## 2020-02-03 NOTE — ED Triage Notes (Signed)
Pt came in from [place] with fall. Pt fell backward in the dining hall and hit her head, posterior portion. No laceration present. Pt not on blood thinners. Dementia at baseline

## 2020-02-03 NOTE — ED Notes (Signed)
[facility] called for pt transport back to [facility]

## 2020-02-03 NOTE — ED Provider Notes (Signed)
[facility]   CSN: [id number] Arrival date & time: 02/03/20  2000     History Chief Complaint  Patient presents with  . Fall    RENALDA LOCKLIN is a 86 y.o. female.  HPI    Patient presents from [facility] nursing facility after fall. Patient cannot provide any details of her history, has a history of Alzheimer's, level 5 caveat.  Per report the patient was at her nursing facility had a witnessed fall backwards, striking her head. No reported loss of consciousness, no change in behavior, interactivity.   Past Medical History:  Diagnosis Date  . Asthma   . Dementia [facility])     Patient Active Problem List   Diagnosis Date Noted  . Chronic respiratory failure with hypoxia (HCC) 05/10/2018  . Pneumonia of left lower lobe due to infectious organism 04/05/2018  . Acute bronchitis 03/28/2018  . Alzheimer's dementia (HCC) 03/28/2018  . Nonallopathic lesion of sacral region 06/06/2013  . SI (sacroiliac) joint dysfunction 05/31/2013    History reviewed. No pertinent surgical history.   OB History   No obstetric history on file.     History reviewed. No pertinent family history.  Social History   Tobacco Use  . Smoking status: Never Smoker  . Smokeless tobacco: Never Used  Substance Use Topics  . Alcohol use: No  . Drug use: Not on file    Home Medications Prior to Admission medications   Medication Sig Start Date End Date Taking? Authorizing Provider  acetaminophen (TYLENOL) 325 MG tablet Take 650 mg by mouth every 6 (six) hours as needed.    [provider]  albuterol (PROVENTIL HFA;VENTOLIN HFA) 108 (90 Base) MCG/ACT inhaler Inhale 2 puffs into the lungs every 6 (six) hours as needed for wheezing or shortness of breath.    [provider]  allopurinol (ZYLOPRIM) 100 MG tablet Take 100 mg by mouth daily.    [provider]  aluminum-magnesium hydroxide-simethicone (MAALOX) 200-200-20 MG/5ML  SUSP Take 30 mLs by mouth as needed.    [provider]  Calcium Carbonate Antacid 600 MG chewable tablet Chew 600 mg by mouth.    [provider]  Camphor-Menthol-Methyl Sal (SALONPAS) 3.03-07-08 % PTCH Apply 2 patches topically daily. Right lateral hip region, ON in AM and off in PM    [provider]  cefdinir (OMNICEF) 300 MG capsule Take 1 capsule (300 mg total) by mouth 2 (two) times daily. Patient not taking: Reported on 07/25/2019 03/28/18   Oretha Milch, MD  Cholecalciferol (VITAMIN D) 50 MCG (2000 UT) CAPS Take 2,000 Units by mouth daily.    [provider]  donepezil (ARICEPT) 10 MG tablet Take 10 mg by mouth at bedtime.    [provider]  famotidine (PEPCID) 20 MG tablet Take 20 mg by mouth daily.    [provider]  furosemide (LASIX) 20 MG tablet Take 20 mg by mouth.    [provider]  melatonin 5 MG TABS Take 5 mg by mouth at bedtime.    [provider]  Multiple Vitamins-Minerals (CERTAVITE SENIOR/ANTIOXIDANT PO) Take 1 tablet by mouth daily.     [provider]  predniSONE (DELTASONE) 10 MG tablet Take 4 tabs by mouth once daily x4 days, then 3 tabs x4 days, 2 tabs x4 days, 1 tab x4 days and stop. Patient not taking: Reported on 07/25/2019 03/28/18   Oretha Milch, MD  PRESCRIPTION MEDICATION Magic cup 1 qd  [provider]  PRESCRIPTION MEDICATION Med pass 2.0 bid    [provider]  senna (SENOKOT) 8.6 MG TABS tablet Take 1 tablet by mouth daily.    [provider]  traMADol (ULTRAM) 50 MG tablet Take 50 mg by mouth every 8 (eight) hours as needed for moderate pain.    [provider]  Turmeric POWD Take by mouth.     [provider]    Allergies    Patient has no known allergies.  Review of Systems   Review of Systems  Unable to perform ROS: Dementia    Physical Exam Updated Vital Signs BP 138/88   Pulse 98   Temp 98.4 F (36.9 C) (Oral)    Resp 20   Ht 5\' 5"  (1.651 m)   Wt 59 kg   SpO2 99%   BMI 21.63 kg/m   Physical Exam Vitals and nursing note reviewed.  Constitutional:      General: She is not in acute distress.    Appearance: She is well-developed.     Comments: elderly female in no obvious distress, moving all extremities spontaneously, speaking spontaneously as well.  HENT:     Head: Normocephalic.     Comments: No appreciable deformity, no abrasion, no laceration. Eyes:     Conjunctiva/sclera: Conjunctivae normal.  Cardiovascular:     Rate and Rhythm: Normal rate and regular rhythm.     Heart sounds: Murmur heard.   Pulmonary:     Effort: Pulmonary effort is normal. No respiratory distress.     Breath sounds: Normal breath sounds. No stridor.  Abdominal:     General: There is no distension.  Musculoskeletal:     Cervical back: No spinous process tenderness or muscular tenderness.  Skin:    General: Skin is warm and dry.  Neurological:     Mental Status: She is alert.     Cranial Nerves: No cranial nerve deficit.     Motor: Atrophy present.     Comments: Speech is clear, though not necessarily appropriate.  She occasionally does answer some questions with the proper response, but otherwise does not follow commands reliably, does not answer questions reliably.  Psychiatric:        Cognition and Memory: Cognition is impaired. Memory is impaired.     ED Results / Procedures / Treatments    Radiology CT Head Wo Contrast  Result Date: 02/03/2020 CLINICAL DATA:  14/06/2019, hit head EXAM: CT HEAD WITHOUT CONTRAST TECHNIQUE: Contiguous axial images were obtained from the base of the skull through the vertex without intravenous contrast. COMPARISON:  09/28/2013 FINDINGS: Brain: Streak artifact from the patient's left cochlear implant limits evaluation of the left cerebral hemisphere. No signs of acute infarct or hemorrhage. Lateral ventricles and midline structures are unremarkable. No acute  extra-axial fluid collections. No mass effect. Vascular: No hyperdense vessel or unexpected calcification. Skull: Small scalp hematoma at the vertex. No underlying fracture. Remainder of the calvarium is unremarkable. Sinuses/Orbits: Sinuses are clear.  Left cochlear implant. Other: None. IMPRESSION: 1. Artifact from indwelling left cochlear implant limits evaluation. 2. Small scalp hematoma at the vertex. No acute intracranial process. Electronically Signed   By: 09/30/2013 M.D.   On: 02/03/2020 21:25    Procedures Procedures (including critical care time)  Medications Ordered in ED Medications - No data to display  ED Course  I have reviewed the triage vital signs and the nursing notes.  Pertinent labs & imaging results that were available during  my care of the patient were reviewed by me and considered in my medical decision making (see chart for details).    MDM Rules/Calculators/A&P Elderly female presents after fall at her nursing facility.  Reportedly, the patient has been interacting in a typical manner, here dementia is evident, she cannot answer questions appropriate, but is moving all extremities spontaneously, speaking in normal voice.  Patient has no pain, that she reports, nor does she respond painfully with physical exam.  Line head CT reassuring, patient discharged in stable condition to a nursing facility. Final Clinical Impression(s) / ED Diagnoses Final diagnoses:  Fall, initial encounter     Gerhard Munch, MD 02/03/20 2222

## 2021-01-30 DEATH — deceased
